# Patient Record
Sex: Male | Born: 1950 | Race: White | Hispanic: No | Marital: Married | Smoking: Current every day smoker
Health system: Southern US, Community
[De-identification: ages and names within clinical notes are randomized; demographics above are authoritative.]

## PROBLEM LIST (undated history)

## (undated) DIAGNOSIS — D45 Polycythemia vera: Secondary | ICD-10-CM

## (undated) DIAGNOSIS — D473 Essential (hemorrhagic) thrombocythemia: Secondary | ICD-10-CM

## (undated) DIAGNOSIS — D75839 Thrombocytosis, unspecified: Secondary | ICD-10-CM

## (undated) DIAGNOSIS — Z720 Tobacco use: Secondary | ICD-10-CM

## (undated) HISTORY — DX: Essential (hemorrhagic) thrombocythemia: D47.3

## (undated) HISTORY — DX: Tobacco use: Z72.0

## (undated) HISTORY — PX: KNEE ARTHROSCOPY W/ MENISCAL REPAIR: SHX1877

## (undated) HISTORY — DX: Thrombocytosis, unspecified: D75.839

## (undated) HISTORY — DX: Polycythemia vera: D45

## (undated) HISTORY — PX: AV FISTULA REPAIR: SHX563

## (undated) HISTORY — PX: TONSILLECTOMY: SHX5217

---

## 1999-08-21 HISTORY — PX: OTHER SURGICAL HISTORY: SHX169

## 2004-07-14 ENCOUNTER — Ambulatory Visit: Payer: Self-pay | Admitting: Orthopaedic Surgery

## 2009-01-06 ENCOUNTER — Ambulatory Visit: Payer: Self-pay | Admitting: Urology

## 2010-09-18 IMAGING — CR DG ABDOMEN 1V
1 series · 1 of 1 positions shown · non-contrast
Comparison: none

REASON FOR EXAM: renal calculi-lithotripsy
COMMENTS:

[view not recorded]
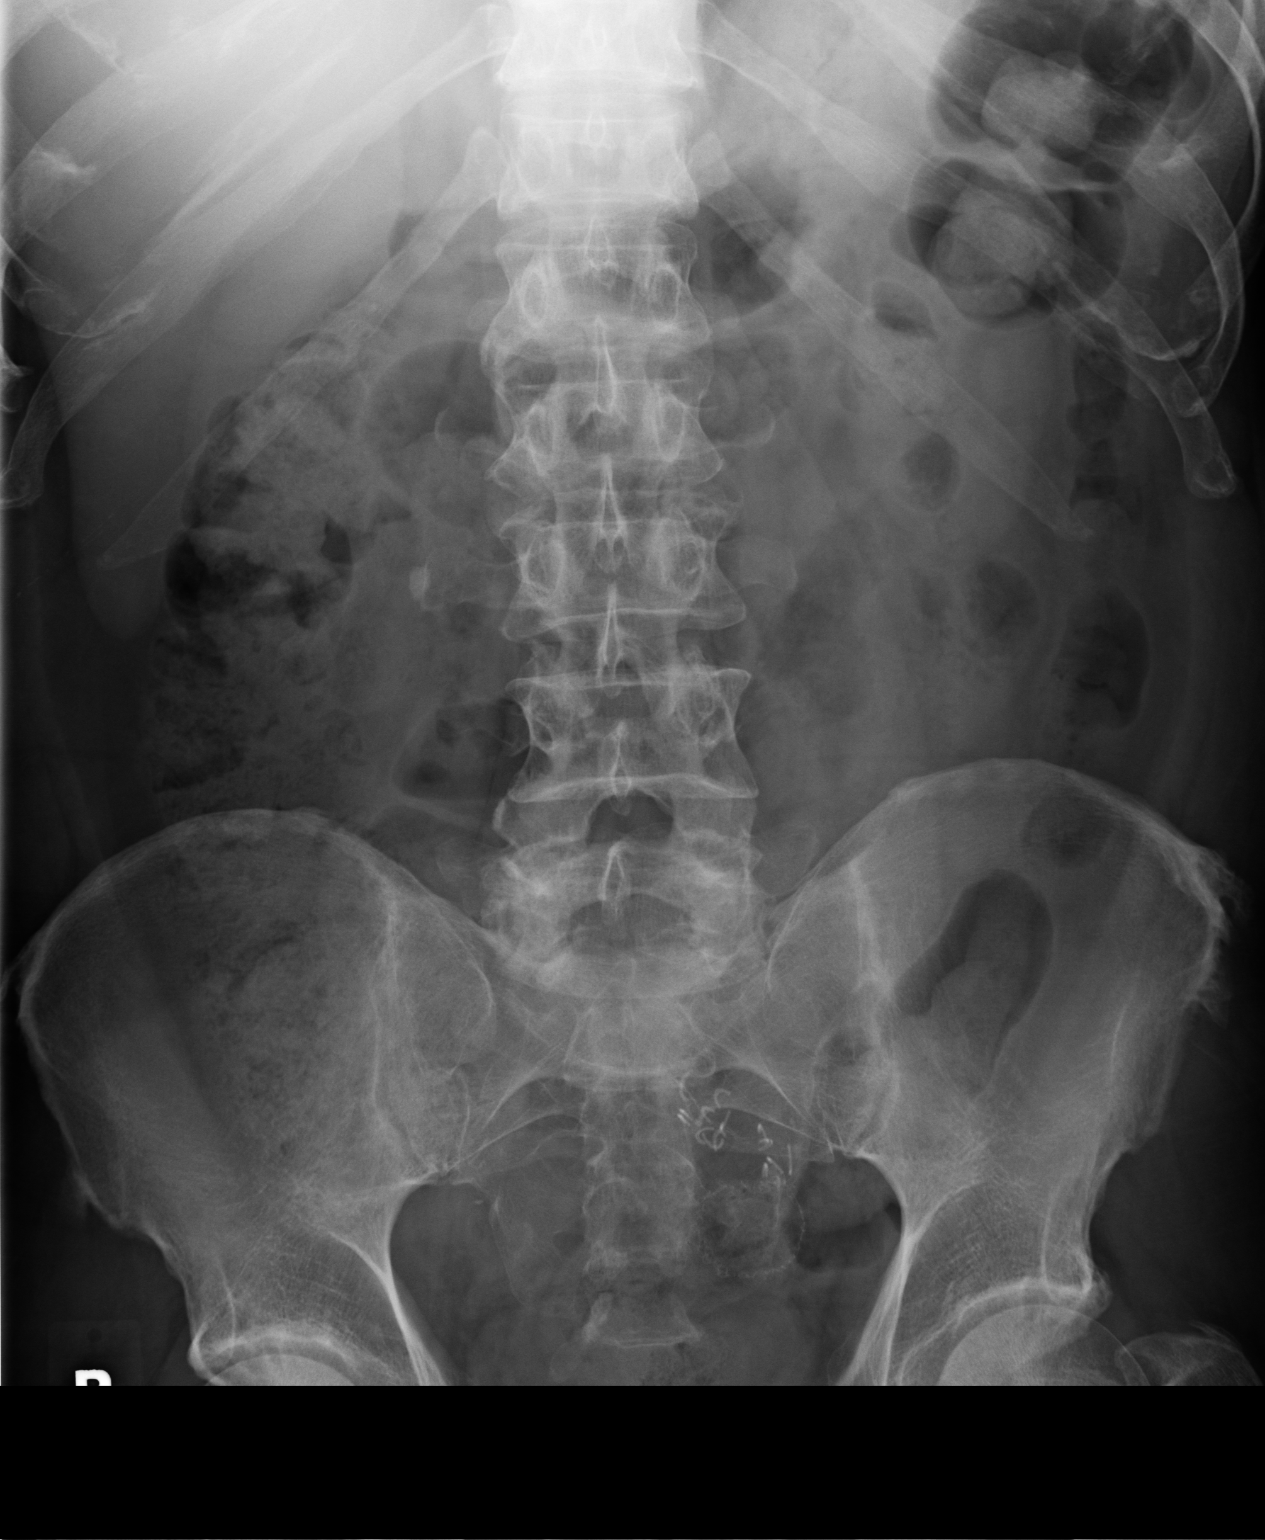

[1 of 1 positions shown; findings below may reference images not displayed]

PROCEDURE:     DXR - DXR KIDNEY URETER BLADDER  - January 06, 2009  [DATE]

RESULT:     There is a 9 mm calcification adjacent to the L3 lumbar
transverse process on the right consistent with a right ureteral stone.
There is a questionable 2 mm density at the upper pole of the right kidney.
This may represent a renal stone or artifact produced by overlying bowel and
bowel content. No left renal or left ureteral stones are seen.
IMPRESSION: 1. Right ureterolithiasis.
2. Possible right nephrolithiasis.

## 2015-12-05 ENCOUNTER — Encounter: Payer: Self-pay | Admitting: *Deleted

## 2015-12-06 ENCOUNTER — Inpatient Hospital Stay: Payer: BLUE CROSS/BLUE SHIELD | Attending: Internal Medicine | Admitting: Internal Medicine

## 2015-12-06 ENCOUNTER — Encounter: Payer: Self-pay | Admitting: Internal Medicine

## 2015-12-06 ENCOUNTER — Inpatient Hospital Stay: Payer: BLUE CROSS/BLUE SHIELD

## 2015-12-06 VITALS — BP 151/78 | HR 91 | Temp 97.1°F | Resp 18 | Ht 68.0 in | Wt 172.6 lb

## 2015-12-06 DIAGNOSIS — D75839 Thrombocytosis, unspecified: Secondary | ICD-10-CM

## 2015-12-06 DIAGNOSIS — D473 Essential (hemorrhagic) thrombocythemia: Secondary | ICD-10-CM

## 2015-12-06 DIAGNOSIS — F17210 Nicotine dependence, cigarettes, uncomplicated: Secondary | ICD-10-CM | POA: Insufficient documentation

## 2015-12-06 DIAGNOSIS — R51 Headache: Secondary | ICD-10-CM

## 2015-12-06 DIAGNOSIS — D72829 Elevated white blood cell count, unspecified: Secondary | ICD-10-CM

## 2015-12-06 DIAGNOSIS — Z79899 Other long term (current) drug therapy: Secondary | ICD-10-CM | POA: Insufficient documentation

## 2015-12-06 LAB — CBC WITH DIFFERENTIAL/PLATELET
Basophils Absolute: 0.1 K/uL (ref 0–0.1)
Basophils Relative: 1 %
Eosinophils Absolute: 0.1 K/uL (ref 0–0.7)
Eosinophils Relative: 1 %
HCT: 52.9 % — ABNORMAL HIGH (ref 40.0–52.0)
Hemoglobin: 18 g/dL (ref 13.0–18.0)
Lymphocytes Relative: 15 %
Lymphs Abs: 1.7 K/uL (ref 1.0–3.6)
MCH: 32 pg (ref 26.0–34.0)
MCHC: 34 g/dL (ref 32.0–36.0)
MCV: 93.9 fL (ref 80.0–100.0)
Monocytes Absolute: 0.6 K/uL (ref 0.2–1.0)
Monocytes Relative: 6 %
Neutro Abs: 8.6 K/uL — ABNORMAL HIGH (ref 1.4–6.5)
Neutrophils Relative %: 77 %
Platelets: 589 K/uL — ABNORMAL HIGH (ref 150–440)
RBC: 5.63 MIL/uL (ref 4.40–5.90)
RDW: 16.5 % — ABNORMAL HIGH (ref 11.5–14.5)
WBC: 11.1 K/uL — ABNORMAL HIGH (ref 3.8–10.6)

## 2015-12-06 LAB — LACTATE DEHYDROGENASE: LDH: 177 U/L (ref 98–192)

## 2015-12-06 NOTE — Progress Notes (Signed)
[facility] NOTE  Patient Care Team: Maryland Pink, MD as PCP - General (Family Medicine)  CHIEF COMPLAINTS/PURPOSE OF CONSULTATION:   # May 2015-THROMBOCYTOSIS [513; April 2017- 719]/ Mild elevated hematocrit/  Mild leukocytosis- ? MPN  HISTORY OF PRESENTING ILLNESS:  Jonathan Evans 65 y.o.  male  Long-standing history of smoking/ current smoker-  Has been referred to [place] for further evaluation of thrombocytosis.  On review of patient's blood counts-  Patient had elevated platelets since May 2015 at 513. More recently has gone up to 719. Patient's most recent hemoglobin is 18 hematocrit 53. White count is elevated at 11.9 predominant neutrophilia.   Patient never had history of strokes;  Or blood clots. Has intermittent headaches.  History of weight loss.  Denies any nausea vomiting difficulty swallowing or pain with swallowing.  Denies any burning sensation in the feet.  His chronic right knee pain/ calf which he attributes to knee injury.  ROS: A complete 10 point review of system is done which is negative except mentioned above in history of present illness  MEDICAL HISTORY:  Past Medical History  Diagnosis Date  . Tobacco abuse   . Thrombocytosis ([place])     SURGICAL HISTORY: Past Surgical History  Procedure Laterality Date  . Av fistula repair    . Knee arthroscopy w/ meniscal repair    . 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  Denies any alcohol. Social History   Social History  . Marital Status: Married    Spouse Name: N/A  . Number of Children: N/A  . Years of Education: N/A   Occupational History  . Not on file.   Social History Main Topics  . Smoking status: Current Every Day Smoker -- 1.50 packs/day for 30 years    Types: Cigarettes  . Smokeless tobacco: Never Used     Comment: uses e-cig  . Alcohol Use: No     Comment: rarely  . Drug Use: No  . Sexual Activity: Not on file   Other Topics  Concern  . Not on file   Social History Narrative    FAMILY HISTORY: dad- stomach cancer- 90s.  Family History  Problem Relation Age of Onset  . Stomach cancer Father     ALLERGIES:  has No Known Allergies.  MEDICATIONS:  Current Outpatient Prescriptions  Medication Sig Dispense Refill  . betamethasone dipropionate (DIPROLENE) 0.05 % cream Apply 1 application topically as needed.    . diclofenac (VOLTAREN) 75 MG EC tablet Take 1 tablet by mouth daily.    Marland Kitchen ibuprofen (ADVIL,MOTRIN) 200 MG tablet Take 200 mg by mouth every 6 (six) hours as needed.    . venlafaxine XR (EFFEXOR-XR) 75 MG 24 hr capsule Take 1 capsule by mouth daily.     No current facility-administered medications for this visit.      Marland Kitchen  PHYSICAL EXAMINATION: ECOG PERFORMANCE STATUS: 0 - Asymptomatic  Filed Vitals:   12/06/15 1143  BP: 151/78  Pulse: 91  Temp: 97.1 F (36.2 C)  Resp: 18   Filed Weights   12/06/15 1143  Weight: 172 lb 9.9 oz (78.3 kg)    GENERAL: Well-nourished well-developed; Alert, no distress and comfortable.   Alone. EYES: no pallor or icterus OROPHARYNX: no thrush or ulceration; dentures. NECK: supple, no masses felt LYMPH:  no palpable lymphadenopathy in the cervical, axillary or inguinal regions LUNGS: bil decreased air entry; No wheeze or crackles HEART/CVS: regular  rate & rhythm and no murmurs; No lower extremity edema ABDOMEN: abdomen soft, non-tender and normal bowel sounds Musculoskeletal:no cyanosis of digits and no clubbing  PSYCH: alert & oriented x 3 with fluent speech NEURO: no focal motor/sensory deficits SKIN:  no rashes or significant lesions    ASSESSMENT & PLAN:   # THROMBOCYTOSIS/ Mild elevated hematocrit/  Mild leukocytosis-  Patient's CBC-  Is concerning for  Myeloproliferative neoplasm.  Recommend  [name] 2 mutation/ in exon 12;  Peripheral blood flow cytometry;  CBC LDH;  MPL;  CALR mutation;  Also check BCR ABL.    Discussed with the patient that  based upon above workup-  CT scan  Bone marrow biopsy might be recommended.  I reviewed the  Bone marrow biopsy procedure/ and potential complications.  I would recommend  A bone marrow biopsy if  Above workup including imaging is inconclusive.   #  Chronic smoker-  Cessation/  Lung cancer screening to discuss at next visit.  #  The above plan of care was discussed the patient in detail.   He will follow-up with me in approximately 2 weeks-  To review the results/ next plan of care.  Thank you Dr. Kary Kos for allowing me to participate in the care of your pleasant patient. Please do not hesitate to contact me with questions or concerns in the interim.  # 30 minutes face-to-face with the patient discussing the above plan of care; more than 50% of time spent on counseling and coordination.    Cammie Sickle, MD 12/06/2015 12:13 PM

## 2015-12-14 LAB — MISC LABCORP TEST (SEND OUT): LABCORP TEST CODE: 489450

## 2015-12-16 LAB — COMP PANEL: LEUKEMIA/LYMPHOMA

## 2015-12-16 LAB — MPL MUTATION ANALYSIS

## 2015-12-16 LAB — BCR-ABL1 FISH
Cells Analyzed: 200
Cells Counted: 200

## 2015-12-16 LAB — JAK2  V617F QUAL. WITH REFLEX TO EXON 12

## 2015-12-22 ENCOUNTER — Inpatient Hospital Stay: Payer: BLUE CROSS/BLUE SHIELD | Admitting: Internal Medicine

## 2015-12-22 ENCOUNTER — Inpatient Hospital Stay: Payer: BLUE CROSS/BLUE SHIELD | Attending: Internal Medicine | Admitting: Internal Medicine

## 2015-12-22 VITALS — BP 144/74 | HR 78 | Temp 98.5°F | Resp 18 | Wt 175.0 lb

## 2015-12-22 DIAGNOSIS — D473 Essential (hemorrhagic) thrombocythemia: Secondary | ICD-10-CM | POA: Insufficient documentation

## 2015-12-22 DIAGNOSIS — Z79899 Other long term (current) drug therapy: Secondary | ICD-10-CM | POA: Insufficient documentation

## 2015-12-22 DIAGNOSIS — Z7982 Long term (current) use of aspirin: Secondary | ICD-10-CM | POA: Diagnosis not present

## 2015-12-22 DIAGNOSIS — F17210 Nicotine dependence, cigarettes, uncomplicated: Secondary | ICD-10-CM | POA: Diagnosis not present

## 2015-12-22 DIAGNOSIS — M25561 Pain in right knee: Secondary | ICD-10-CM | POA: Diagnosis not present

## 2015-12-22 DIAGNOSIS — R51 Headache: Secondary | ICD-10-CM | POA: Insufficient documentation

## 2015-12-22 DIAGNOSIS — D45 Polycythemia vera: Secondary | ICD-10-CM | POA: Diagnosis not present

## 2015-12-22 MED ORDER — HYDROXYUREA 500 MG PO CAPS: 500.0000 mg | ORAL_CAPSULE | Freq: Every day | ORAL | Status: DC

## 2015-12-22 NOTE — Progress Notes (Signed)
Patient ambulates without assistance, brought to exam room 4.  Vitals and medication record documented and updated, information provided by patient.  Patient denies pain or discomfort.

## 2015-12-22 NOTE — Progress Notes (Signed)
[facility]

## 2015-12-22 NOTE — Progress Notes (Signed)
[facility] NOTE  Patient Care Team: Maryland Pink, MD as PCP - General (Family Medicine)  CHIEF COMPLAINTS/PURPOSE OF CONSULTATION:   # April 2017- ET-PV- JAK-2 POSITIVE [HCT 52.9/platelets- 588]; Recom phlebotomy/ Hydrea 500 milligrams a day/ Aspirin 325 mg a day  HISTORY OF PRESENTING ILLNESS:  Jonathan Evans 65 y.o.  male  Long-standing history of smoking/ current smoker-  It is here to review the results of the workup that was ordered for elevated platelets and also hematocrit.  Patient continues to have intermittent headaches. Denies any burning sensation in the feet.  His chronic right knee pain/ calf which he attributes to knee injury.  ROS: A complete 10 point review of system is done which is negative except mentioned above in history of present illness  MEDICAL HISTORY:  Past Medical History  Diagnosis Date  . Tobacco abuse   . Thrombocytosis ([place])     SURGICAL HISTORY: Past Surgical History  Procedure Laterality Date  . Av fistula repair    . Knee arthroscopy w/ meniscal repair    . 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 Social History   Social History  . Marital Status: Married    Spouse Name: N/A  . Number of Children: N/A  . Years of Education: N/A   Occupational History  . Not on file.   Social History Main Topics  . Smoking status: Current Every Day Smoker -- 1.50 packs/day for 30 years    Types: Cigarettes  . Smokeless tobacco: Never Used     Comment: uses e-cig  . Alcohol Use: No     Comment: rarely  . Drug Use: No  . Sexual Activity: Not on file   Other Topics Concern  . Not on file   Social History Narrative    FAMILY HISTORY: dad- stomach cancer- 5s.  Family History  Problem Relation Age of Onset  . Stomach cancer Father     ALLERGIES:  has No Known Allergies.  MEDICATIONS:  Current Outpatient Prescriptions  Medication Sig Dispense Refill   . diclofenac (VOLTAREN) 75 MG EC tablet Take 1 tablet by mouth daily.    Marland Kitchen ibuprofen (ADVIL,MOTRIN) 200 MG tablet Take 200 mg by mouth every 6 (six) hours as needed.    . venlafaxine XR (EFFEXOR-XR) 75 MG 24 hr capsule Take 1 capsule by mouth daily.     No current facility-administered medications for this visit.      Marland Kitchen  PHYSICAL EXAMINATION: ECOG PERFORMANCE STATUS: 0 - Asymptomatic  Filed Vitals:   12/22/15 1433  BP: 144/74  Pulse: 78  Temp: 98.5 F (36.9 C)   Filed Weights   12/22/15 1433  Weight: 175 lb 0.7 oz (79.4 kg)    GENERAL: Well-nourished well-developed; Alert, no distress and comfortable.   Alone.    ASSESSMENT & PLAN:   # Polycythemia vera/essential thrombocytosis- JAK-2 POSITIVE- discussed the risk of stroke and thromboembolic events from elevated hematocrit and elevated platelets. I recommend phlebotomy to bring the hematocrit less than 45; patient is currently on aspirin 81 mg 2 a day.   # I also reviewed the importance of using Hydrea in burningthe hematocrit down and also the platelet count down.  I discussed the potential side effects of Hydrea including but not limited to LFT abnormalities rash and palms and soles; Oral ulcers.   # SMOKING-also discussed smoking cessation. Patient seems to motivated. We will  Lung cancer  screening to discuss at next visit.  # phlebotomy on a weekly basis [hold if hematocrit less than 43]; weekly H&H; follow-up with me in 6 weeks CBC and LFTs.   # 25 minutes face-to-face with the patient discussing the above plan of care; more than 50% of time spent on prognosis/ natural history; counseling and coordination.     Cammie Sickle, MD 12/22/2015 2:41 PM

## 2015-12-23 ENCOUNTER — Telehealth: Payer: Self-pay | Admitting: *Deleted

## 2015-12-23 NOTE — Telephone Encounter (Signed)
Pt's wife called states [facility]  I called pt back and explained the [facility] [address] Fax Confirmation received.   Pt [facility] [address].  [facility] [address]  This pharmacy will transfer the rx to that pharmacy. Pt informed.

## 2015-12-29 ENCOUNTER — Inpatient Hospital Stay: Payer: BLUE CROSS/BLUE SHIELD

## 2015-12-29 ENCOUNTER — Encounter: Payer: Self-pay | Admitting: *Deleted

## 2015-12-29 VITALS — BP 123/80 | HR 76 | Resp 20

## 2015-12-29 DIAGNOSIS — D45 Polycythemia vera: Secondary | ICD-10-CM

## 2015-12-29 DIAGNOSIS — D473 Essential (hemorrhagic) thrombocythemia: Secondary | ICD-10-CM

## 2015-12-29 LAB — HEMOGLOBIN: Hemoglobin: 18 g/dL (ref 13.0–18.0)

## 2015-12-29 LAB — HEMATOCRIT: HCT: 52.6 % — ABNORMAL HIGH (ref 40.0–52.0)

## 2015-12-29 MED ORDER — HYDROXYUREA 500 MG PO CAPS: 500.0000 mg | ORAL_CAPSULE | Freq: Every day | ORAL | Status: DC

## 2015-12-29 NOTE — Progress Notes (Signed)
Received a call from [facility] lab regarding patient rx.  He informed Doni that he has checked with his pharmacy, [facility]. [address], but the rx for Hydrea was not sent.  It is documented in his chart that he did [facility] [address] but he is now requesting [facility].  [facility] and will send a new rx to [facility].

## 2015-12-29 NOTE — Progress Notes (Signed)
Pt came for phlebotomy today. Pt spoke to [place], Therapist, sports in infusion. States that his RX for hydrea needs to be sent to pharmacy. Never sent per patient.  I explained to pt that [facility] [address] [facility] [address] per phone note. [facility] [address]  He states that he meant to say [facility] on [address].  He states that the [facility] will [facility] [address] to pick up rx.

## 2015-12-30 ENCOUNTER — Other Ambulatory Visit: Payer: Self-pay | Admitting: *Deleted

## 2015-12-30 ENCOUNTER — Telehealth: Payer: Self-pay | Admitting: *Deleted

## 2015-12-30 DIAGNOSIS — D473 Essential (hemorrhagic) thrombocythemia: Secondary | ICD-10-CM

## 2015-12-30 DIAGNOSIS — D45 Polycythemia vera: Secondary | ICD-10-CM

## 2015-12-30 MED ORDER — HYDROXYUREA 500 MG PO CAPS: 500.0000 mg | ORAL_CAPSULE | Freq: Every day | ORAL | Status: DC

## 2015-12-30 NOTE — Telephone Encounter (Signed)
Patients family sent fax stating Hydrea was sent to the wrong pharmacy, Hydrea RX sent to [facility] on [address] in [place]. Patient preferred pharmacy updated in system.

## 2016-01-05 ENCOUNTER — Inpatient Hospital Stay: Payer: BLUE CROSS/BLUE SHIELD

## 2016-01-05 DIAGNOSIS — D45 Polycythemia vera: Secondary | ICD-10-CM | POA: Diagnosis not present

## 2016-01-05 DIAGNOSIS — D473 Essential (hemorrhagic) thrombocythemia: Secondary | ICD-10-CM

## 2016-01-05 LAB — HEMATOCRIT: HCT: 51.4 % (ref 40.0–52.0)

## 2016-01-05 LAB — HEMOGLOBIN: Hemoglobin: 17.7 g/dL (ref 13.0–18.0)

## 2016-01-12 ENCOUNTER — Inpatient Hospital Stay: Payer: BLUE CROSS/BLUE SHIELD

## 2016-01-12 ENCOUNTER — Other Ambulatory Visit: Payer: Self-pay | Admitting: Internal Medicine

## 2016-01-12 VITALS — BP 115/72 | HR 76 | Temp 96.3°F | Resp 18

## 2016-01-12 DIAGNOSIS — D45 Polycythemia vera: Secondary | ICD-10-CM | POA: Diagnosis not present

## 2016-01-12 DIAGNOSIS — D473 Essential (hemorrhagic) thrombocythemia: Secondary | ICD-10-CM

## 2016-01-12 LAB — HEMOGLOBIN: Hemoglobin: 16.5 g/dL (ref 13.0–18.0)

## 2016-01-12 LAB — HEMATOCRIT: HCT: 46.9 % (ref 40.0–52.0)

## 2016-01-19 ENCOUNTER — Inpatient Hospital Stay: Payer: BLUE CROSS/BLUE SHIELD | Attending: Internal Medicine

## 2016-01-19 ENCOUNTER — Inpatient Hospital Stay: Payer: BLUE CROSS/BLUE SHIELD

## 2016-01-19 VITALS — BP 122/71 | HR 73 | Temp 97.8°F | Resp 20

## 2016-01-19 DIAGNOSIS — F17210 Nicotine dependence, cigarettes, uncomplicated: Secondary | ICD-10-CM | POA: Insufficient documentation

## 2016-01-19 DIAGNOSIS — D45 Polycythemia vera: Secondary | ICD-10-CM

## 2016-01-19 DIAGNOSIS — D473 Essential (hemorrhagic) thrombocythemia: Secondary | ICD-10-CM | POA: Diagnosis not present

## 2016-01-19 DIAGNOSIS — Z79899 Other long term (current) drug therapy: Secondary | ICD-10-CM | POA: Diagnosis not present

## 2016-01-19 LAB — HEMOGLOBIN: Hemoglobin: 15.7 g/dL (ref 13.0–18.0)

## 2016-01-19 LAB — HEMATOCRIT: HCT: 44.7 % (ref 40.0–52.0)

## 2016-01-26 ENCOUNTER — Inpatient Hospital Stay: Payer: BLUE CROSS/BLUE SHIELD

## 2016-01-26 DIAGNOSIS — D473 Essential (hemorrhagic) thrombocythemia: Secondary | ICD-10-CM

## 2016-01-26 DIAGNOSIS — D45 Polycythemia vera: Secondary | ICD-10-CM | POA: Diagnosis not present

## 2016-01-26 LAB — HEMOGLOBIN: HEMOGLOBIN: 15 g/dL (ref 13.0–18.0)

## 2016-01-26 LAB — HEMATOCRIT: HEMATOCRIT: 43.5 % (ref 40.0–52.0)

## 2016-02-02 ENCOUNTER — Inpatient Hospital Stay (HOSPITAL_BASED_OUTPATIENT_CLINIC_OR_DEPARTMENT_OTHER): Payer: BLUE CROSS/BLUE SHIELD | Admitting: Oncology

## 2016-02-02 ENCOUNTER — Inpatient Hospital Stay: Payer: BLUE CROSS/BLUE SHIELD

## 2016-02-02 ENCOUNTER — Other Ambulatory Visit: Payer: Self-pay | Admitting: Internal Medicine

## 2016-02-02 VITALS — BP 136/60 | HR 69 | Temp 96.0°F | Resp 18 | Wt 173.7 lb

## 2016-02-02 DIAGNOSIS — D473 Essential (hemorrhagic) thrombocythemia: Secondary | ICD-10-CM

## 2016-02-02 DIAGNOSIS — F17210 Nicotine dependence, cigarettes, uncomplicated: Secondary | ICD-10-CM | POA: Diagnosis not present

## 2016-02-02 DIAGNOSIS — Z79899 Other long term (current) drug therapy: Secondary | ICD-10-CM

## 2016-02-02 DIAGNOSIS — D45 Polycythemia vera: Secondary | ICD-10-CM

## 2016-02-02 LAB — COMPREHENSIVE METABOLIC PANEL
ALT: 20 U/L (ref 17–63)
AST: 20 U/L (ref 15–41)
Albumin: 3.8 g/dL (ref 3.5–5.0)
Alkaline Phosphatase: 99 U/L (ref 38–126)
Anion gap: 10 (ref 5–15)
BILIRUBIN TOTAL: 0.7 mg/dL (ref 0.3–1.2)
BUN: 27 mg/dL — AB (ref 6–20)
CHLORIDE: 108 mmol/L (ref 101–111)
CO2: 23 mmol/L (ref 22–32)
Calcium: 8.9 mg/dL (ref 8.9–10.3)
Creatinine, Ser: 1.15 mg/dL (ref 0.61–1.24)
GFR calc Af Amer: >60 mL/min (ref >60)
GFR calc non Af Amer: >60 mL/min (ref >60)
GLUCOSE: 145 mg/dL — AB (ref 65–99)
POTASSIUM: 3.9 mmol/L (ref 3.5–5.1)
SODIUM: 141 mmol/L (ref 135–145)
Total Protein: 6.3 g/dL — ABNORMAL LOW (ref 6.5–8.1)

## 2016-02-02 LAB — CBC WITH DIFFERENTIAL/PLATELET
BASOS ABS: 0.1 10*3/uL (ref 0–0.1)
BASOS PCT: 1 %
Eosinophils Absolute: 0.2 10*3/uL (ref 0–0.7)
Eosinophils Relative: 2 %
HEMATOCRIT: 42.9 % (ref 40.0–52.0)
Hemoglobin: 14.9 g/dL (ref 13.0–18.0)
Lymphocytes Relative: 25 %
Lymphs Abs: 2.2 10*3/uL (ref 1.0–3.6)
MCH: 33.8 pg (ref 26.0–34.0)
MCHC: 34.7 g/dL (ref 32.0–36.0)
MCV: 97.2 fL (ref 80.0–100.0)
MONO ABS: 0.6 10*3/uL (ref 0.2–1.0)
Monocytes Relative: 7 %
NEUTROS ABS: 5.6 10*3/uL (ref 1.4–6.5)
Neutrophils Relative %: 65 %
PLATELETS: 603 10*3/uL — AB (ref 150–440)
RBC: 4.41 MIL/uL (ref 4.40–5.90)
RDW: 18.4 % — AB (ref 11.5–14.5)
WBC: 8.6 10*3/uL (ref 3.8–10.6)

## 2016-02-02 NOTE — Progress Notes (Signed)
States is general muscle aches from increased activity at work but otherwise feels well.

## 2016-02-02 NOTE — Progress Notes (Signed)
[facility]  [phone]  Fax:([phone]) [place] DOB: [birth year]  MR#: [id number]  QPY#:[id number]  Patient Care Team: Maryland Pink, MD as PCP - General (Family Medicine)  CHIEF COMPLAINT:  Chief Complaint  Patient presents with  . polycythemia    INTERVAL HISTORY:  Patient returns to clinic today for lab results and possible phlebotomy. He currently feels well and is asymptomatic. He continues with Hydrea 500 mg daily and is tolerating is well without side effects. He denies any pain. He has no complaints today.  REVIEW OF SYSTEMS:   Review of Systems  Constitutional: Negative.   HENT: Negative.   Eyes: Negative.   Respiratory: Negative.   Cardiovascular: Negative.   Gastrointestinal: Negative.   Genitourinary: Negative.   Musculoskeletal: Negative.   Skin: Negative.   Neurological: Negative.   Endo/Heme/Allergies: Negative.   Psychiatric/Behavioral: Negative.     As per HPI. Otherwise, a complete review of systems is negatve.  ONCOLOGY HISTORY:  No history exists.    PAST MEDICAL HISTORY: Past Medical History  Diagnosis Date  . Tobacco abuse   . Thrombocytosis ([place])     PAST SURGICAL HISTORY: Past Surgical History  Procedure Laterality Date  . Av fistula repair    . Knee arthroscopy w/ meniscal repair    . Tonsillectomy      FAMILY HISTORY Family History  Problem Relation Age of Onset  . Stomach cancer Father     ADVANCED DIRECTIVES:    HEALTH MAINTENANCE: Social History  Substance Use Topics  . Smoking status: Current Every Day Smoker -- 1.50 packs/day for 30 years    Types: Cigarettes  . Smokeless tobacco: Never Used     Comment: uses e-cig  . Alcohol Use: No     Comment: rarely    No Known Allergies  Current Outpatient Prescriptions  Medication Sig Dispense Refill  . diclofenac (VOLTAREN) 75 MG EC tablet Take 1 tablet by mouth daily.    . hydroxyurea (HYDREA) 500 MG capsule Take 1 capsule  (500 mg total) by mouth daily. May take with food to minimize GI side effects. 30 capsule 3  . ibuprofen (ADVIL,MOTRIN) 200 MG tablet Take 200 mg by mouth every 6 (six) hours as needed.    . venlafaxine XR (EFFEXOR-XR) 75 MG 24 hr capsule Take 1 capsule by mouth daily.     No current facility-administered medications for this visit.    OBJECTIVE: BP 136/60 mmHg  Pulse 69  Temp(Src) 96 F (35.6 C) (Tympanic)  Resp 18  Wt 173 lb 11.6 oz (78.8 kg)   Body mass index is 26.42 kg/(m^2).    ECOG FS:0 - Asymptomatic  General: Well-developed, well-nourished, no acute distress. Lungs: Clear to auscultation bilaterally. Heart: Regular rate and rhythm. No rubs, murmurs, or gallops.  Musculoskeletal: No edema, cyanosis, or clubbing. Neuro: Alert, answering all questions appropriately.  Psych: Normal affect.   LAB RESULTS:  Appointment on 02/02/2016  Component Date Value Ref Range Status  . WBC 02/02/2016 8.6  3.8 - 10.6 K/uL Final  . RBC 02/02/2016 4.41  4.40 - 5.90 MIL/uL Final  . Hemoglobin 02/02/2016 14.9  13.0 - 18.0 g/dL Final  . HCT 02/02/2016 42.9  40.0 - 52.0 % Final  . MCV 02/02/2016 97.2  80.0 - 100.0 fL Final  . MCH 02/02/2016 33.8  26.0 - 34.0 pg Final  . MCHC 02/02/2016 34.7  32.0 - 36.0 g/dL Final  . RDW 02/02/2016 18.4* 11.5 - 14.5 %  Final  . Platelets 02/02/2016 603* 150 - 440 K/uL Final  . Neutrophils Relative % 02/02/2016 65   Final  . Neutro Abs 02/02/2016 5.6  1.4 - 6.5 K/uL Final  . Lymphocytes Relative 02/02/2016 25   Final  . Lymphs Abs 02/02/2016 2.2  1.0 - 3.6 K/uL Final  . Monocytes Relative 02/02/2016 7   Final  . Monocytes Absolute 02/02/2016 0.6  0.2 - 1.0 K/uL Final  . Eosinophils Relative 02/02/2016 2   Final  . Eosinophils Absolute 02/02/2016 0.2  0 - 0.7 K/uL Final  . Basophils Relative 02/02/2016 1   Final  . Basophils Absolute 02/02/2016 0.1  0 - 0.1 K/uL Final  . Sodium 02/02/2016 141  135 - 145 mmol/L Final  . Potassium 02/02/2016 3.9  3.5 - 5.1  mmol/L Final  . Chloride 02/02/2016 108  101 - 111 mmol/L Final  . CO2 02/02/2016 23  22 - 32 mmol/L Final  . Glucose, Bld 02/02/2016 145* 65 - 99 mg/dL Final  . BUN 02/02/2016 27* 6 - 20 mg/dL Final  . Creatinine, Ser 02/02/2016 1.15  0.61 - 1.24 mg/dL Final  . Calcium 02/02/2016 8.9  8.9 - 10.3 mg/dL Final  . Total Protein 02/02/2016 6.3* 6.5 - 8.1 g/dL Final  . Albumin 02/02/2016 3.8  3.5 - 5.0 g/dL Final  . AST 02/02/2016 20  15 - 41 U/L Final  . ALT 02/02/2016 20  17 - 63 U/L Final  . Alkaline Phosphatase 02/02/2016 99  38 - 126 U/L Final  . Total Bilirubin 02/02/2016 0.7  0.3 - 1.2 mg/dL Final  . GFR calc non Af Amer 02/02/2016 >60  >60 mL/min Final  . GFR calc Af Amer 02/02/2016 >60  >60 mL/min Final   Comment: (NOTE) The eGFR has been calculated using the CKD EPI equation. This calculation has not been validated in all clinical situations. eGFR's persistently <60 mL/min signify possible Chronic Kidney Disease.   . Anion gap 02/02/2016 10  5 - 15 Final    STUDIES: No results found.  ASSESSMENT: Polycythemia  PLAN:    1. Polycythemia vera/essential thrombocytosis- JAK-2 POSITIVE- HCT today is 42.9, no phlebotomy needed. Platelets are 603 today - increase Hydrea - take 500 mg BID.   2. Smoking- Discuss lung cancer screening at next visit.  3. Return to clinic in 6 weeks for HGB/HCT and possible phlebotomy and then in 3 months for CBC/CMP and further evaluation.  Patient expressed understanding and was in agreement with this plan. He also understands that He can call clinic at any time with any questions, concerns, or complaints.   Dr. Rogue Bussing was available for consultation and review of plan of care for this patient.    Mayra Reel, NP   02/02/2016 2:45 PM

## 2016-02-03 ENCOUNTER — Inpatient Hospital Stay: Payer: BLUE CROSS/BLUE SHIELD | Admitting: Internal Medicine

## 2016-02-03 ENCOUNTER — Inpatient Hospital Stay: Payer: BLUE CROSS/BLUE SHIELD

## 2016-03-15 ENCOUNTER — Inpatient Hospital Stay: Payer: BLUE CROSS/BLUE SHIELD | Attending: Internal Medicine

## 2016-03-15 ENCOUNTER — Inpatient Hospital Stay: Payer: BLUE CROSS/BLUE SHIELD

## 2016-03-15 DIAGNOSIS — F17210 Nicotine dependence, cigarettes, uncomplicated: Secondary | ICD-10-CM | POA: Insufficient documentation

## 2016-03-15 DIAGNOSIS — D45 Polycythemia vera: Secondary | ICD-10-CM

## 2016-03-15 DIAGNOSIS — D473 Essential (hemorrhagic) thrombocythemia: Secondary | ICD-10-CM | POA: Diagnosis not present

## 2016-03-15 DIAGNOSIS — Z79899 Other long term (current) drug therapy: Secondary | ICD-10-CM | POA: Insufficient documentation

## 2016-03-15 LAB — HEMOGLOBIN: Hemoglobin: 17.1 g/dL (ref 13.0–18.0)

## 2016-03-15 LAB — HEMATOCRIT: HEMATOCRIT: 49.6 % (ref 40.0–52.0)

## 2016-03-16 ENCOUNTER — Other Ambulatory Visit: Payer: Self-pay | Admitting: Internal Medicine

## 2016-03-19 ENCOUNTER — Other Ambulatory Visit: Payer: Self-pay | Admitting: *Deleted

## 2016-03-19 ENCOUNTER — Telehealth: Payer: Self-pay | Admitting: *Deleted

## 2016-03-19 DIAGNOSIS — D45 Polycythemia vera: Secondary | ICD-10-CM

## 2016-03-19 NOTE — Telephone Encounter (Signed)
-----   Message from Cammie Sickle, MD sent at 03/16/2016  7:37 PM EDT ----- Please check the patient if taking Hydrea 500 mg twice a day.  I recommend  H&H weekly/ phlebotomy  Weekly-as his hematocrit has increased to 49. Please inform pt.

## 2016-03-19 NOTE — Telephone Encounter (Signed)
Called patient to inform him that MD recommends weekly H/H and phlebotomy.  [place] the beach [place] so he will be available week after next.

## 2016-03-26 ENCOUNTER — Inpatient Hospital Stay: Payer: BLUE CROSS/BLUE SHIELD

## 2016-03-26 ENCOUNTER — Inpatient Hospital Stay: Payer: BLUE CROSS/BLUE SHIELD | Attending: Internal Medicine

## 2016-03-26 VITALS — BP 124/73 | HR 74 | Temp 97.3°F | Resp 18

## 2016-03-26 DIAGNOSIS — D45 Polycythemia vera: Secondary | ICD-10-CM | POA: Insufficient documentation

## 2016-03-26 DIAGNOSIS — F17210 Nicotine dependence, cigarettes, uncomplicated: Secondary | ICD-10-CM | POA: Insufficient documentation

## 2016-03-26 DIAGNOSIS — Z79899 Other long term (current) drug therapy: Secondary | ICD-10-CM | POA: Diagnosis not present

## 2016-03-26 DIAGNOSIS — D473 Essential (hemorrhagic) thrombocythemia: Secondary | ICD-10-CM | POA: Insufficient documentation

## 2016-03-26 LAB — HEMATOCRIT: HCT: 46.1 % (ref 40.0–52.0)

## 2016-03-26 LAB — HEMOGLOBIN: Hemoglobin: 16 g/dL (ref 13.0–18.0)

## 2016-04-02 ENCOUNTER — Inpatient Hospital Stay: Payer: BLUE CROSS/BLUE SHIELD

## 2016-04-02 DIAGNOSIS — D45 Polycythemia vera: Secondary | ICD-10-CM | POA: Diagnosis not present

## 2016-04-02 LAB — HEMOGLOBIN: HEMOGLOBIN: 15.5 g/dL (ref 13.0–18.0)

## 2016-04-02 LAB — HEMATOCRIT: HCT: 44.6 % (ref 40.0–52.0)

## 2016-04-09 ENCOUNTER — Inpatient Hospital Stay: Payer: BLUE CROSS/BLUE SHIELD

## 2016-04-09 DIAGNOSIS — D45 Polycythemia vera: Secondary | ICD-10-CM

## 2016-04-09 LAB — HEMATOCRIT: HEMATOCRIT: 42.9 % (ref 40.0–52.0)

## 2016-04-09 LAB — HEMOGLOBIN: Hemoglobin: 15 g/dL (ref 13.0–18.0)

## 2016-04-16 ENCOUNTER — Inpatient Hospital Stay: Payer: BLUE CROSS/BLUE SHIELD

## 2016-04-16 VITALS — BP 125/73 | HR 76 | Temp 96.7°F | Resp 18

## 2016-04-16 DIAGNOSIS — D45 Polycythemia vera: Secondary | ICD-10-CM

## 2016-04-16 LAB — HEMATOCRIT: HCT: 44.5 % (ref 40.0–52.0)

## 2016-04-16 LAB — HEMOGLOBIN: HEMOGLOBIN: 15.3 g/dL (ref 13.0–18.0)

## 2016-04-23 ENCOUNTER — Other Ambulatory Visit: Payer: Self-pay | Admitting: Internal Medicine

## 2016-04-23 DIAGNOSIS — D45 Polycythemia vera: Secondary | ICD-10-CM

## 2016-04-23 DIAGNOSIS — D473 Essential (hemorrhagic) thrombocythemia: Secondary | ICD-10-CM

## 2016-04-24 ENCOUNTER — Other Ambulatory Visit: Payer: Self-pay | Admitting: *Deleted

## 2016-04-24 ENCOUNTER — Inpatient Hospital Stay: Payer: BLUE CROSS/BLUE SHIELD

## 2016-04-24 ENCOUNTER — Inpatient Hospital Stay: Payer: BLUE CROSS/BLUE SHIELD | Attending: Internal Medicine

## 2016-04-24 DIAGNOSIS — D45 Polycythemia vera: Secondary | ICD-10-CM | POA: Diagnosis present

## 2016-04-24 DIAGNOSIS — D473 Essential (hemorrhagic) thrombocythemia: Secondary | ICD-10-CM | POA: Diagnosis not present

## 2016-04-24 DIAGNOSIS — F17210 Nicotine dependence, cigarettes, uncomplicated: Secondary | ICD-10-CM | POA: Diagnosis not present

## 2016-04-24 DIAGNOSIS — Z79899 Other long term (current) drug therapy: Secondary | ICD-10-CM | POA: Diagnosis not present

## 2016-04-24 LAB — HEMATOCRIT: HCT: 42.5 % (ref 40.0–52.0)

## 2016-04-24 LAB — HEMOGLOBIN: Hemoglobin: 14.8 g/dL (ref 13.0–18.0)

## 2016-04-24 MED ORDER — HYDROXYUREA 500 MG PO CAPS: 500.0000 mg | ORAL_CAPSULE | Freq: Two times a day (BID) | ORAL | 3 refills | Status: DC

## 2016-04-24 NOTE — Progress Notes (Signed)
Pt came to cancer center. rx for hydrea should have been written as 500 mg hydrea twice daily. Instead, rx was written for once daily.  Spoke with MD. Pt was instructed previously to take twice daily.  Resubmitted new rx for hydrea 500 mg twice daily #60 and 3Rfs v/o Dr. Rogue Bussing

## 2016-05-04 ENCOUNTER — Other Ambulatory Visit: Payer: Self-pay

## 2016-05-04 ENCOUNTER — Encounter: Payer: Self-pay | Admitting: Internal Medicine

## 2016-05-04 ENCOUNTER — Inpatient Hospital Stay: Payer: BLUE CROSS/BLUE SHIELD

## 2016-05-04 ENCOUNTER — Inpatient Hospital Stay (HOSPITAL_BASED_OUTPATIENT_CLINIC_OR_DEPARTMENT_OTHER): Payer: BLUE CROSS/BLUE SHIELD | Admitting: Internal Medicine

## 2016-05-04 VITALS — BP 152/74 | HR 76 | Temp 99.0°F | Resp 18 | Ht 68.0 in | Wt 172.0 lb

## 2016-05-04 DIAGNOSIS — D45 Polycythemia vera: Secondary | ICD-10-CM

## 2016-05-04 DIAGNOSIS — Z79899 Other long term (current) drug therapy: Secondary | ICD-10-CM | POA: Diagnosis not present

## 2016-05-04 DIAGNOSIS — F17210 Nicotine dependence, cigarettes, uncomplicated: Secondary | ICD-10-CM

## 2016-05-04 DIAGNOSIS — D473 Essential (hemorrhagic) thrombocythemia: Secondary | ICD-10-CM

## 2016-05-04 LAB — CBC WITH DIFFERENTIAL/PLATELET
BASOS PCT: 1 %
Basophils Absolute: 0.1 10*3/uL (ref 0–0.1)
EOS ABS: 0.1 10*3/uL (ref 0–0.7)
Eosinophils Relative: 2 %
HEMATOCRIT: 42.4 % (ref 40.0–52.0)
HEMOGLOBIN: 14.7 g/dL (ref 13.0–18.0)
LYMPHS ABS: 1.9 10*3/uL (ref 1.0–3.6)
Lymphocytes Relative: 22 %
MCH: 35.4 pg — AB (ref 26.0–34.0)
MCHC: 34.7 g/dL (ref 32.0–36.0)
MCV: 102 fL — ABNORMAL HIGH (ref 80.0–100.0)
MONO ABS: 0.5 10*3/uL (ref 0.2–1.0)
MONOS PCT: 6 %
NEUTROS PCT: 69 %
Neutro Abs: 6.1 10*3/uL (ref 1.4–6.5)
Platelets: 457 10*3/uL — ABNORMAL HIGH (ref 150–440)
RBC: 4.15 MIL/uL — ABNORMAL LOW (ref 4.40–5.90)
RDW: 15.8 % — AB (ref 11.5–14.5)
WBC: 8.7 10*3/uL (ref 3.8–10.6)

## 2016-05-04 LAB — COMPREHENSIVE METABOLIC PANEL
ALBUMIN: 3.9 g/dL (ref 3.5–5.0)
ALK PHOS: 94 U/L (ref 38–126)
ALT: 16 U/L — ABNORMAL LOW (ref 17–63)
ANION GAP: 5 (ref 5–15)
AST: 17 U/L (ref 15–41)
BILIRUBIN TOTAL: 0.7 mg/dL (ref 0.3–1.2)
BUN: 23 mg/dL — ABNORMAL HIGH (ref 6–20)
CALCIUM: 8.7 mg/dL — AB (ref 8.9–10.3)
CHLORIDE: 108 mmol/L (ref 101–111)
CO2: 24 mmol/L (ref 22–32)
CREATININE: 1.09 mg/dL (ref 0.61–1.24)
GFR calc non Af Amer: >60 mL/min (ref >60)
Glucose, Bld: 105 mg/dL — ABNORMAL HIGH (ref 65–99)
Potassium: 4 mmol/L (ref 3.5–5.1)
SODIUM: 137 mmol/L (ref 135–145)
Total Protein: 6.4 g/dL — ABNORMAL LOW (ref 6.5–8.1)

## 2016-05-04 NOTE — Progress Notes (Signed)
Patient reports intermittent episodes of minor numbness in feet after standing for long periods of time.  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 He is uncertain if this is related to the hydrea. The numbness tingling started after taking hydrea.  However, the aches in his feet started before this medication.

## 2016-05-04 NOTE — Progress Notes (Signed)
[facility] NOTE  Patient Care Team: Maryland Pink, MD as PCP - General (Family Medicine)  CHIEF COMPLAINTS/PURPOSE OF CONSULTATION:   # April 2017- ESSENTIAL THROMBOCYTOSIS/-POLYCYTHEMIA VERA- JAK-2 POSITIVE [HCT 52.9/platelets- 010]; Recom phlebotomy/ Hydrea 500 BID / Aspirin 325 mg a day  # Smoker- Recm quitting  HISTORY OF PRESENTING ILLNESS:  Jonathan Evans 65 y.o.  male  history of [name] 2 positive polycythemia vera currently on Hydrea 500 mg twice a day; and also need phlebotomy every month or so is here for follow-up.  Patient denies any significant headaches. His chronic mild tingling and numbness in his feet. Not any worse. He is an aspirin. Denies any chest pain or shortness of breath. Unfortunately continues to smoke.  ROS: A complete 10 point review of system is done which is negative except mentioned above in history of present illness  MEDICAL HISTORY:  Past Medical History:  Diagnosis Date  . Polycythemia vera ([place])   . Thrombocytosis ([place])   . Tobacco abuse     SURGICAL HISTORY: Past Surgical History:  Procedure Laterality Date  . AV FISTULA REPAIR    . KNEE ARTHROSCOPY W/ MENISCAL REPAIR    . 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 Social History   Social History  . Marital status: Married    Spouse name: N/A  . Number of children: N/A  . Years of education: N/A   Occupational History  . Not on file.   Social History Main Topics  . Smoking status: Current Every Day Smoker    Packs/day: 1.50    Years: 30.00    Types: Cigarettes  . Smokeless tobacco: Never Used     Comment: uses e-cig  . Alcohol use No     Comment: rarely  . Drug use: No  . Sexual activity: Not on file   Other Topics Concern  . Not on file   Social History Narrative  . No narrative on file    FAMILY HISTORY: dad- stomach cancer- 36s.  Family History  Problem Relation Age of Onset   . Stomach cancer Father     ALLERGIES:  has No Known Allergies.  MEDICATIONS:  Current Outpatient Prescriptions  Medication Sig Dispense Refill  . hydroxyurea (HYDREA) 500 MG capsule Take 1 capsule (500 mg total) by mouth 2 (two) times daily. May take with food to minimize GI side effects. 60 capsule 3  . ibuprofen (ADVIL,MOTRIN) 200 MG tablet Take 200 mg by mouth every 6 (six) hours as needed.    . diclofenac (VOLTAREN) 75 MG EC tablet Take 1 tablet by mouth daily.  1   No current facility-administered medications for this visit.       Marland Kitchen  PHYSICAL EXAMINATION: ECOG PERFORMANCE STATUS: 0 - Asymptomatic  Vitals:   05/04/16 1404  BP: (!) 152/74  Pulse: 76  Resp: 18  Temp: 99 F (37.2 C)   Filed Weights   05/04/16 1404  Weight: 172 lb (78 kg)    GENERAL: Well-nourished well-developed; Alert, no distress and comfortable.   Alone. EYES: no pallor or icterus OROPHARYNX: no thrush or ulceration; dentures. NECK: supple, no masses felt LYMPH:  no palpable lymphadenopathy in the cervical, axillary or inguinal regions LUNGS: bil decreased air entry; No wheeze or crackles HEART/CVS: regular rate & rhythm and no murmurs; No lower extremity edema ABDOMEN: abdomen soft, non-tender and normal bowel sounds Musculoskeletal:no cyanosis of digits and no  clubbing  PSYCH: alert & oriented x 3 with fluent speech NEURO: no focal motor/sensory deficits SKIN:  no rashes or significant lesions    ASSESSMENT & PLAN:   Polycythemia vera ([facility]) # Polycythemia vera/essential thrombocytosis- JAK-2 POSITIVE; hydrea 500 BID. Patient's platelets have significantly improved from 700 currently 450.  # check H&H q 4 weeks/ continue phlebotomy if hematocrit greater than 43 . Continue aspirin  # SMOKING-also discussed smoking cessation. I discussed lung cancer screening program ; however he Wants to hold off Ct screening program.   # follow up in 6 months/labs/ phlebotomy.        Cammie Sickle, MD 05/06/2016 11:09 AM

## 2016-05-04 NOTE — Assessment & Plan Note (Addendum)
#   Polycythemia vera/essential thrombocytosis- JAK-2 POSITIVE; hydrea 500 BID. Patient's platelets have significantly improved from 700 currently 450.  # check H&H q 4 weeks/ continue phlebotomy if hematocrit greater than 43 . Continue aspirin  # SMOKING-also discussed smoking cessation. I discussed lung cancer screening program ; however he Wants to hold off Ct screening program.   # follow up in 6 months/labs/ phlebotomy.

## 2016-06-01 ENCOUNTER — Inpatient Hospital Stay: Payer: BLUE CROSS/BLUE SHIELD

## 2016-06-08 ENCOUNTER — Inpatient Hospital Stay: Payer: BLUE CROSS/BLUE SHIELD | Attending: Internal Medicine

## 2016-06-08 ENCOUNTER — Inpatient Hospital Stay: Payer: BLUE CROSS/BLUE SHIELD

## 2016-06-08 VITALS — BP 128/75 | HR 86 | Temp 98.8°F | Resp 18

## 2016-06-08 DIAGNOSIS — F17210 Nicotine dependence, cigarettes, uncomplicated: Secondary | ICD-10-CM | POA: Insufficient documentation

## 2016-06-08 DIAGNOSIS — D473 Essential (hemorrhagic) thrombocythemia: Secondary | ICD-10-CM | POA: Insufficient documentation

## 2016-06-08 DIAGNOSIS — D45 Polycythemia vera: Secondary | ICD-10-CM | POA: Insufficient documentation

## 2016-06-08 DIAGNOSIS — Z79899 Other long term (current) drug therapy: Secondary | ICD-10-CM | POA: Insufficient documentation

## 2016-06-08 LAB — HEMATOCRIT: HEMATOCRIT: 44.8 % (ref 40.0–52.0)

## 2016-06-08 LAB — HEMOGLOBIN: Hemoglobin: 15.5 g/dL (ref 13.0–18.0)

## 2016-06-29 ENCOUNTER — Inpatient Hospital Stay: Payer: BLUE CROSS/BLUE SHIELD

## 2016-06-29 ENCOUNTER — Inpatient Hospital Stay: Payer: BLUE CROSS/BLUE SHIELD | Attending: Internal Medicine

## 2016-06-29 DIAGNOSIS — Z79899 Other long term (current) drug therapy: Secondary | ICD-10-CM | POA: Diagnosis not present

## 2016-06-29 DIAGNOSIS — D45 Polycythemia vera: Secondary | ICD-10-CM | POA: Diagnosis not present

## 2016-06-29 DIAGNOSIS — F17210 Nicotine dependence, cigarettes, uncomplicated: Secondary | ICD-10-CM | POA: Diagnosis not present

## 2016-06-29 DIAGNOSIS — D473 Essential (hemorrhagic) thrombocythemia: Secondary | ICD-10-CM | POA: Insufficient documentation

## 2016-06-29 LAB — HEMATOCRIT: HCT: 41.9 % (ref 40.0–52.0)

## 2016-06-29 LAB — HEMOGLOBIN: HEMOGLOBIN: 14.5 g/dL (ref 13.0–18.0)

## 2016-07-27 ENCOUNTER — Inpatient Hospital Stay: Payer: BLUE CROSS/BLUE SHIELD

## 2016-07-31 ENCOUNTER — Inpatient Hospital Stay: Payer: BLUE CROSS/BLUE SHIELD

## 2016-07-31 ENCOUNTER — Inpatient Hospital Stay: Payer: BLUE CROSS/BLUE SHIELD | Attending: Internal Medicine

## 2016-07-31 ENCOUNTER — Other Ambulatory Visit: Payer: Self-pay

## 2016-07-31 DIAGNOSIS — D45 Polycythemia vera: Secondary | ICD-10-CM

## 2016-07-31 DIAGNOSIS — Z79899 Other long term (current) drug therapy: Secondary | ICD-10-CM | POA: Insufficient documentation

## 2016-07-31 DIAGNOSIS — F17210 Nicotine dependence, cigarettes, uncomplicated: Secondary | ICD-10-CM | POA: Insufficient documentation

## 2016-07-31 DIAGNOSIS — D473 Essential (hemorrhagic) thrombocythemia: Secondary | ICD-10-CM | POA: Insufficient documentation

## 2016-07-31 LAB — HEMATOCRIT: HCT: 44.4 % (ref 40.0–52.0)

## 2016-07-31 LAB — HEMOGLOBIN: HEMOGLOBIN: 15.4 g/dL (ref 13.0–18.0)

## 2016-08-24 ENCOUNTER — Inpatient Hospital Stay: Payer: BLUE CROSS/BLUE SHIELD

## 2016-08-24 ENCOUNTER — Inpatient Hospital Stay: Payer: BLUE CROSS/BLUE SHIELD | Attending: Internal Medicine

## 2016-08-24 VITALS — BP 136/88 | HR 81 | Temp 98.3°F | Resp 20

## 2016-08-24 DIAGNOSIS — D45 Polycythemia vera: Secondary | ICD-10-CM

## 2016-08-24 LAB — HEMATOCRIT: HCT: 44.1 % (ref 40.0–52.0)

## 2016-08-24 LAB — HEMOGLOBIN: Hemoglobin: 15 g/dL (ref 13.0–18.0)

## 2016-09-21 ENCOUNTER — Inpatient Hospital Stay: Payer: BLUE CROSS/BLUE SHIELD

## 2016-09-21 ENCOUNTER — Inpatient Hospital Stay: Payer: BLUE CROSS/BLUE SHIELD | Attending: Internal Medicine

## 2016-10-19 ENCOUNTER — Inpatient Hospital Stay: Payer: Medicare Other

## 2016-11-01 ENCOUNTER — Other Ambulatory Visit: Payer: BLUE CROSS/BLUE SHIELD

## 2016-11-01 ENCOUNTER — Ambulatory Visit: Payer: BLUE CROSS/BLUE SHIELD | Admitting: Internal Medicine

## 2016-11-02 ENCOUNTER — Inpatient Hospital Stay: Payer: Medicare Other

## 2016-11-02 ENCOUNTER — Inpatient Hospital Stay: Payer: Medicare Other | Attending: Internal Medicine | Admitting: Internal Medicine

## 2016-11-02 VITALS — BP 144/72 | HR 88 | Temp 98.5°F | Resp 18 | Wt 181.5 lb

## 2016-11-02 VITALS — BP 139/76 | HR 78 | Resp 18

## 2016-11-02 DIAGNOSIS — Z79899 Other long term (current) drug therapy: Secondary | ICD-10-CM

## 2016-11-02 DIAGNOSIS — D45 Polycythemia vera: Secondary | ICD-10-CM

## 2016-11-02 DIAGNOSIS — F17210 Nicotine dependence, cigarettes, uncomplicated: Secondary | ICD-10-CM | POA: Insufficient documentation

## 2016-11-02 DIAGNOSIS — D473 Essential (hemorrhagic) thrombocythemia: Secondary | ICD-10-CM

## 2016-11-02 LAB — CBC WITH DIFFERENTIAL/PLATELET
Basophils Absolute: 0.1 10*3/uL (ref 0–0.1)
Basophils Relative: 1 %
EOS ABS: 0.1 10*3/uL (ref 0–0.7)
EOS PCT: 2 %
HCT: 44.2 % (ref 40.0–52.0)
Hemoglobin: 15.2 g/dL (ref 13.0–18.0)
LYMPHS PCT: 19 %
Lymphs Abs: 1.7 10*3/uL (ref 1.0–3.6)
MCH: 35.3 pg — ABNORMAL HIGH (ref 26.0–34.0)
MCHC: 34.3 g/dL (ref 32.0–36.0)
MCV: 102.8 fL — ABNORMAL HIGH (ref 80.0–100.0)
Monocytes Absolute: 0.6 10*3/uL (ref 0.2–1.0)
Monocytes Relative: 7 %
NEUTROS PCT: 71 %
Neutro Abs: 6.6 10*3/uL — ABNORMAL HIGH (ref 1.4–6.5)
PLATELETS: 629 10*3/uL — AB (ref 150–440)
RBC: 4.3 MIL/uL — AB (ref 4.40–5.90)
RDW: 14.8 % — ABNORMAL HIGH (ref 11.5–14.5)
WBC: 9.1 10*3/uL (ref 3.8–10.6)

## 2016-11-02 LAB — COMPREHENSIVE METABOLIC PANEL
ALT: 17 U/L (ref 17–63)
AST: 26 U/L (ref 15–41)
Albumin: 3.7 g/dL (ref 3.5–5.0)
Alkaline Phosphatase: 92 U/L (ref 38–126)
Anion gap: 6 (ref 5–15)
BUN: 26 mg/dL — AB (ref 6–20)
CHLORIDE: 107 mmol/L (ref 101–111)
CO2: 25 mmol/L (ref 22–32)
CREATININE: 1.19 mg/dL (ref 0.61–1.24)
Calcium: 8.7 mg/dL — ABNORMAL LOW (ref 8.9–10.3)
GFR calc Af Amer: >60 mL/min (ref >60)
GFR calc non Af Amer: >60 mL/min (ref >60)
GLUCOSE: 150 mg/dL — AB (ref 65–99)
Potassium: 3.9 mmol/L (ref 3.5–5.1)
SODIUM: 138 mmol/L (ref 135–145)
Total Bilirubin: 0.7 mg/dL (ref 0.3–1.2)
Total Protein: 6.6 g/dL (ref 6.5–8.1)

## 2016-11-02 NOTE — Progress Notes (Signed)
[facility] NOTE  Patient Care Team: Maryland Pink, MD as PCP - General (Family Medicine)  CHIEF COMPLAINTS/PURPOSE OF CONSULTATION:   # April 2017- ESSENTIAL THROMBOCYTOSIS/-POLYCYTHEMIA VERA- JAK-2 POSITIVE [HCT 52.9/platelets- 983]; Recom phlebotomy/ Hydrea 500 BID / Aspirin 325 mg a day  # Smoker- Recm quitting  HISTORY OF PRESENTING ILLNESS:  Jonathan Evans 66 y.o.  male  history of [name] 2 positive polycythemia vera/ET currently on Hydrea 500 mg twice a day; and also need phlebotomy every 2 months or so is here for follow-up.  Unfortunately continues to smoke.Patient denies any significant headaches. Denies any chest pain or shortness of breath. His chronic mild tingling and numbness in his feet. Not any worse. He is an aspirin.   ROS: A complete 10 point review of system is done which is negative except mentioned above in history of present illness  MEDICAL HISTORY:  Past Medical History:  Diagnosis Date  . Polycythemia vera ([place])   . Thrombocytosis ([place])   . Tobacco abuse     SURGICAL HISTORY: Past Surgical History:  Procedure Laterality Date  . AV FISTULA REPAIR    . KNEE ARTHROSCOPY W/ MENISCAL REPAIR    . 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  Denies any alcohol. Social History   Social History  . Marital status: Married    Spouse name: N/A  . Number of children: N/A  . Years of education: N/A   Occupational History  . Not on file.   Social History Main Topics  . Smoking status: Current Every Day Smoker    Packs/day: 1.50    Years: 30.00    Types: Cigarettes  . Smokeless tobacco: Never Used     Comment: uses e-cig  . Alcohol use No     Comment: rarely  . Drug use: No  . Sexual activity: Not on file   Other Topics Concern  . Not on file   Social History Narrative  . No narrative on file    FAMILY HISTORY: dad- stomach cancer- 38s.  Family History  Problem Relation Age of  Onset  . Stomach cancer Father     ALLERGIES:  has No Known Allergies.  MEDICATIONS:  Current Outpatient Prescriptions  Medication Sig Dispense Refill  . aspirin EC 81 MG tablet Take 81 mg by mouth daily.    . diclofenac (VOLTAREN) 75 MG EC tablet Take 1 tablet by mouth daily.  1  . hydroxyurea (HYDREA) 500 MG capsule Take 1 capsule (500 mg total) by mouth 2 (two) times daily. May take with food to minimize GI side effects. 60 capsule 3  . ibuprofen (ADVIL,MOTRIN) 200 MG tablet Take 200 mg by mouth every 6 (six) hours as needed.    . venlafaxine XR (EFFEXOR-XR) 75 MG 24 hr capsule Take by mouth.     No current facility-administered medications for this visit.       Marland Kitchen  PHYSICAL EXAMINATION: ECOG PERFORMANCE STATUS: 0 - Asymptomatic  Vitals:   11/02/16 1406  BP: (!) 144/72  Pulse: 88  Resp: 18  Temp: 98.5 F (36.9 C)   Filed Weights   11/02/16 1406  Weight: 181 lb 8 oz (82.3 kg)    GENERAL: Well-nourished well-developed; Alert, no distress and comfortable.   Alone. EYES: no pallor or icterus OROPHARYNX: no thrush or ulceration; dentures. NECK: supple, no masses felt LYMPH:  no palpable lymphadenopathy in the cervical, axillary or inguinal regions LUNGS: bil  decreased air entry; No wheeze or crackles HEART/CVS: regular rate & rhythm and no murmurs; No lower extremity edema ABDOMEN: abdomen soft, non-tender and normal bowel sounds Musculoskeletal:no cyanosis of digits and no clubbing  PSYCH: alert & oriented x 3 with fluent speech NEURO: no focal motor/sensory deficits SKIN:  no rashes or significant lesions    ASSESSMENT & PLAN:   Polycythemia vera (HCC) # Polycythemia vera/essential thrombocytosis- JAK-2 POSITIVE; hydrea 500 BID. Patient's platelets today-  625.  # check H&H q 8 weeks/ continue phlebotomy if hematocrit greater than 43 . Continue aspirin  # SMOKING- Also discussed smoking cessation.smoking- cessation discussed; cutting down.     # follow up  in 6 months/labs/ phlebotomy.      Cammie Sickle, MD 11/03/2016 12:15 PM

## 2016-11-02 NOTE — Progress Notes (Signed)
Patient here today for follow up.  Patient states no new concerns today  

## 2016-11-02 NOTE — Assessment & Plan Note (Addendum)
#   Polycythemia vera/essential thrombocytosis- JAK-2 POSITIVE; hydrea 500 BID. Patient's platelets today-  625.  # check H&H q 8 weeks/ continue phlebotomy if hematocrit greater than 43 . Continue aspirin  # SMOKING- Also discussed smoking cessation.smoking- cessation discussed; cutting down.     # follow up in 6 months/labs/ phlebotomy.

## 2017-01-02 ENCOUNTER — Inpatient Hospital Stay: Payer: Medicare Other

## 2017-01-02 ENCOUNTER — Inpatient Hospital Stay: Payer: Medicare Other | Attending: Internal Medicine

## 2017-01-02 VITALS — BP 114/77 | HR 77 | Temp 98.1°F | Resp 18

## 2017-01-02 DIAGNOSIS — D473 Essential (hemorrhagic) thrombocythemia: Secondary | ICD-10-CM | POA: Insufficient documentation

## 2017-01-02 DIAGNOSIS — D45 Polycythemia vera: Secondary | ICD-10-CM

## 2017-01-02 DIAGNOSIS — Z79899 Other long term (current) drug therapy: Secondary | ICD-10-CM | POA: Diagnosis not present

## 2017-01-02 DIAGNOSIS — F17210 Nicotine dependence, cigarettes, uncomplicated: Secondary | ICD-10-CM | POA: Diagnosis not present

## 2017-01-02 LAB — HEMOGLOBIN: Hemoglobin: 15.6 g/dL (ref 13.0–18.0)

## 2017-01-02 LAB — HEMATOCRIT: HCT: 45.6 % (ref 40.0–52.0)

## 2017-03-04 ENCOUNTER — Inpatient Hospital Stay: Payer: Medicare Other | Attending: Internal Medicine

## 2017-03-04 ENCOUNTER — Inpatient Hospital Stay: Payer: Medicare Other

## 2017-03-04 VITALS — BP 123/79 | HR 76 | Temp 97.8°F | Resp 20

## 2017-03-04 DIAGNOSIS — D45 Polycythemia vera: Secondary | ICD-10-CM | POA: Diagnosis not present

## 2017-03-04 LAB — HEMOGLOBIN: HEMOGLOBIN: 15.8 g/dL (ref 13.0–18.0)

## 2017-03-04 LAB — HEMATOCRIT: HEMATOCRIT: 45.9 % (ref 40.0–52.0)

## 2017-03-07 ENCOUNTER — Other Ambulatory Visit: Payer: Self-pay | Admitting: Internal Medicine

## 2017-03-07 DIAGNOSIS — D45 Polycythemia vera: Secondary | ICD-10-CM

## 2017-03-07 DIAGNOSIS — D473 Essential (hemorrhagic) thrombocythemia: Secondary | ICD-10-CM

## 2017-03-07 NOTE — Telephone Encounter (Signed)
There is a dosing difference from your note and what is requested form pharmacy. PLease advise   Polycythemia vera ([place]) A&P Encounter Date: 11/02/2016 Cammie Sickle, MD  Oncology    # Polycythemia vera/essential thrombocytosis- JAK-2 POSITIVE; hydrea 500 BID. Patient's platelets today-  625.

## 2017-05-06 ENCOUNTER — Inpatient Hospital Stay: Payer: Medicare Other | Attending: Internal Medicine

## 2017-05-06 ENCOUNTER — Other Ambulatory Visit: Payer: Self-pay

## 2017-05-06 ENCOUNTER — Inpatient Hospital Stay: Payer: Medicare Other

## 2017-05-06 ENCOUNTER — Inpatient Hospital Stay (HOSPITAL_BASED_OUTPATIENT_CLINIC_OR_DEPARTMENT_OTHER): Payer: Medicare Other | Admitting: Internal Medicine

## 2017-05-06 VITALS — BP 150/81 | HR 98 | Temp 93.0°F | Resp 16 | Wt 179.0 lb

## 2017-05-06 VITALS — BP 122/73 | HR 77 | Resp 18

## 2017-05-06 DIAGNOSIS — R200 Anesthesia of skin: Secondary | ICD-10-CM | POA: Insufficient documentation

## 2017-05-06 DIAGNOSIS — R202 Paresthesia of skin: Secondary | ICD-10-CM | POA: Insufficient documentation

## 2017-05-06 DIAGNOSIS — Z7982 Long term (current) use of aspirin: Secondary | ICD-10-CM

## 2017-05-06 DIAGNOSIS — F17210 Nicotine dependence, cigarettes, uncomplicated: Secondary | ICD-10-CM

## 2017-05-06 DIAGNOSIS — D45 Polycythemia vera: Secondary | ICD-10-CM

## 2017-05-06 DIAGNOSIS — Z79899 Other long term (current) drug therapy: Secondary | ICD-10-CM | POA: Insufficient documentation

## 2017-05-06 DIAGNOSIS — D473 Essential (hemorrhagic) thrombocythemia: Secondary | ICD-10-CM

## 2017-05-06 LAB — HEMOGLOBIN: Hemoglobin: 15.7 g/dL (ref 13.0–18.0)

## 2017-05-06 LAB — HEMATOCRIT: HEMATOCRIT: 45.3 % (ref 40.0–52.0)

## 2017-05-06 MED ORDER — HYDROXYUREA 500 MG PO CAPS: 1000.0000 mg | ORAL_CAPSULE | Freq: Every day | ORAL | 3 refills | Status: DC

## 2017-05-06 NOTE — Progress Notes (Signed)
[facility] NOTE  Patient Care Team: Maryland Pink, MD as PCP - General (Family Medicine)  CHIEF COMPLAINTS/PURPOSE OF CONSULTATION:   # April 2017- ESSENTIAL THROMBOCYTOSIS/-POLYCYTHEMIA VERA- JAK-2 POSITIVE [HCT 52.9/platelets- 154]; Recom phlebotomy/ Hydrea 500 BID / Aspirin 325 mg a day  # Smoker- Recm quitting  HISTORY OF PRESENTING ILLNESS:  Jonathan Evans 66 y.o.  male  history of [name] 2 positive polycythemia vera/ET currently on Hydrea 500 mg once a day. He has been needing therapeutic phlebotomy every 30 days and returns to clinic today for follow-up. He says he plans to partially retire next month.   Unfortunately, he continues to smoke. Denies headaches, chest pain, sob. He has mild, chronic tingling and numbness in his fingertips and toes. It is stable and unchanged. He continues aspirin 325 mg daily.   ROS: A complete 10 point review of system is done which is negative except mentioned above in history of present illness  MEDICAL HISTORY:  Past Medical History:  Diagnosis Date  . Polycythemia vera ([place])   . Thrombocytosis ([place])   . Tobacco abuse     SURGICAL HISTORY: Past Surgical History:  Procedure Laterality Date  . AV FISTULA REPAIR    . KNEE ARTHROSCOPY W/ MENISCAL REPAIR    . 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 Social History   Social History  . Marital status: Married    Spouse name: N/A  . Number of children: N/A  . Years of education: N/A   Occupational History  . Not on file.   Social History Main Topics  . Smoking status: Current Every Day Smoker    Packs/day: 1.50    Years: 30.00    Types: Cigarettes  . Smokeless tobacco: Never Used     Comment: uses e-cig  . Alcohol use No     Comment: rarely  . Drug use: No  . Sexual activity: Not on file   Other Topics Concern  . Not on file   Social History Narrative  . No narrative on file     FAMILY HISTORY: dad- stomach cancer- 50s.  Family History  Problem Relation Age of Onset  . Stomach cancer Father     ALLERGIES:  has No Known Allergies.  MEDICATIONS:  Current Outpatient Prescriptions  Medication Sig Dispense Refill  . aspirin EC 81 MG tablet Take 81 mg by mouth daily.    . diclofenac (VOLTAREN) 75 MG EC tablet Take 1 tablet by mouth daily.  1  . hydroxyurea (HYDREA) 500 MG capsule Take 2 capsules (1,000 mg total) by mouth daily. 60 capsule 3  . ibuprofen (ADVIL,MOTRIN) 200 MG tablet Take 200 mg by mouth every 6 (six) hours as needed.    . venlafaxine XR (EFFEXOR-XR) 75 MG 24 hr capsule Take by mouth.     No current facility-administered medications for this visit.    PHYSICAL EXAMINATION: ECOG PERFORMANCE STATUS: 0 - Asymptomatic  Vitals:   05/06/17 1404  BP: (!) 150/81  Pulse: 98  Resp: 16  Temp: (!) 93 F (33.9 C)   Filed Weights   05/06/17 1404  Weight: 179 lb (81.2 kg)   CBC Latest Ref Rng & Units 05/06/2017 03/04/2017 01/02/2017  WBC 3.8 - 10.6 K/uL - - -  Hemoglobin 13.0 - 18.0 g/dL 15.7 15.8 15.6  Hematocrit 40.0 - 52.0 % 45.3 45.9 45.6  Platelets 150 - 440 K/uL - - -  CMP Latest Ref Rng & Units 11/02/2016 05/04/2016 02/02/2016  Glucose 65 - 99 mg/dL 150(H) 105(H) 145(H)  BUN 6 - 20 mg/dL 26(H) 23(H) 27(H)  Creatinine 0.61 - 1.24 mg/dL 1.19 1.09 1.15  Sodium 135 - 145 mmol/L 138 137 141  Potassium 3.5 - 5.1 mmol/L 3.9 4.0 3.9  Chloride 101 - 111 mmol/L 107 108 108  CO2 22 - 32 mmol/L 25 24 23   Calcium 8.9 - 10.3 mg/dL 8.7(L) 8.7(L) 8.9  Total Protein 6.5 - 8.1 g/dL 6.6 6.4(L) 6.3(L)  Total Bilirubin 0.3 - 1.2 mg/dL 0.7 0.7 0.7  Alkaline Phos 38 - 126 U/L 92 94 99  AST 15 - 41 U/L 26 17 20   ALT 17 - 63 U/L 17 16(L) 20     GENERAL: Well-nourished well-developed; Alert, no distress and comfortable.   Alone. EYES: no pallor or icterus OROPHARYNX: no thrush or ulceration; dentures. NECK: supple, no masses felt LYMPH:  no palpable  lymphadenopathy in the cervical, axillary or inguinal regions LUNGS: bil decreased air entry; No wheeze or crackles HEART/CVS: regular rate & rhythm and no murmurs; No lower extremity edema ABDOMEN: abdomen soft, non-tender and normal bowel sounds Musculoskeletal:no cyanosis of digits and no clubbing  PSYCH: alert & oriented x 3 with fluent speech NEURO: no focal motor/sensory deficits SKIN:  no rashes or significant lesions  ASSESSMENT & PLAN:   Polycythemia vera (HCC) # Polycythemia vera/essential thrombocytosis- JAK-2 POSITIVE. Patient's platelet count has been consistently increased. Will increase hydrea from 500mg  qd to 1000mg  qd. Hemogloin 15.7 today, hematocrit 45.3. Patient has been receiving therapeutic phlebotomy if hematocrit greater than 43 with a goal of hematocrit <45. Patient continues aspirin 325 mg qd.   # SMOKING- Also discussed smoking cessation.smoking- cessation discussed;patient continues to smoke but says he is trying to reduce his smoking. Low dose CT lung screening due in December. Will refer to get scheduled.   # will plan for phlebotomy today with labs and phlebotomy monthly then see MD again in 3 months for labs and phlebotomy.    Jonathan Hemenway G. Zenia Resides, NP 05/06/17 4:45 PM    Jonathan Au, NP 05/06/2017 4:45 PM

## 2017-05-06 NOTE — Assessment & Plan Note (Addendum)
#   Polycythemia vera/essential thrombocytosis- JAK-2 POSITIVE. Patient's platelet count has been consistently increased. Will increase hydrea from 500mg  qd to 1000mg  qd. Hemogloin 15.7 today, hematocrit 45.3. Patient has been receiving therapeutic phlebotomy if hematocrit greater than 43 with a goal of hematocrit <45. Patient continues aspirin 325 mg qd.   # SMOKING- Also discussed smoking cessation.smoking- cessation discussed;patient continues to smoke but says he is trying to reduce his smoking. Low dose CT lung screening due in December. Will refer to get scheduled.   # will plan for phlebotomy today with labs and phlebotomy monthly then see MD again in 3 months for labs and phlebotomy.

## 2017-06-05 ENCOUNTER — Inpatient Hospital Stay: Payer: Medicare Other | Attending: Internal Medicine

## 2017-06-05 ENCOUNTER — Inpatient Hospital Stay: Payer: Medicare Other

## 2017-06-05 DIAGNOSIS — D45 Polycythemia vera: Secondary | ICD-10-CM | POA: Insufficient documentation

## 2017-06-05 DIAGNOSIS — F17210 Nicotine dependence, cigarettes, uncomplicated: Secondary | ICD-10-CM | POA: Diagnosis not present

## 2017-06-05 DIAGNOSIS — R200 Anesthesia of skin: Secondary | ICD-10-CM | POA: Diagnosis not present

## 2017-06-05 DIAGNOSIS — Z79899 Other long term (current) drug therapy: Secondary | ICD-10-CM | POA: Insufficient documentation

## 2017-06-05 DIAGNOSIS — Z7982 Long term (current) use of aspirin: Secondary | ICD-10-CM | POA: Insufficient documentation

## 2017-06-05 DIAGNOSIS — R202 Paresthesia of skin: Secondary | ICD-10-CM | POA: Diagnosis not present

## 2017-06-05 LAB — CBC WITH DIFFERENTIAL/PLATELET
Basophils Absolute: 0 10*3/uL (ref 0–0.1)
Basophils Relative: 0 %
EOS ABS: 0.1 10*3/uL (ref 0–0.7)
EOS PCT: 1 %
HCT: 42 % (ref 40.0–52.0)
Hemoglobin: 14.3 g/dL (ref 13.0–18.0)
LYMPHS ABS: 1.4 10*3/uL (ref 1.0–3.6)
Lymphocytes Relative: 19 %
MCH: 35.5 pg — AB (ref 26.0–34.0)
MCHC: 34.2 g/dL (ref 32.0–36.0)
MCV: 103.7 fL — ABNORMAL HIGH (ref 80.0–100.0)
MONO ABS: 0.5 10*3/uL (ref 0.2–1.0)
Monocytes Relative: 6 %
Neutro Abs: 5.6 10*3/uL (ref 1.4–6.5)
Neutrophils Relative %: 74 %
PLATELETS: 375 10*3/uL (ref 150–440)
RBC: 4.05 MIL/uL — AB (ref 4.40–5.90)
RDW: 17.5 % — AB (ref 11.5–14.5)
WBC: 7.6 10*3/uL (ref 3.8–10.6)

## 2017-06-13 DIAGNOSIS — Z125 Encounter for screening for malignant neoplasm of prostate: Secondary | ICD-10-CM | POA: Diagnosis not present

## 2017-06-13 DIAGNOSIS — D473 Essential (hemorrhagic) thrombocythemia: Secondary | ICD-10-CM | POA: Diagnosis not present

## 2017-06-13 DIAGNOSIS — Z0000 Encounter for general adult medical examination without abnormal findings: Secondary | ICD-10-CM | POA: Diagnosis not present

## 2017-07-31 DIAGNOSIS — Z125 Encounter for screening for malignant neoplasm of prostate: Secondary | ICD-10-CM | POA: Diagnosis not present

## 2017-07-31 DIAGNOSIS — R319 Hematuria, unspecified: Secondary | ICD-10-CM | POA: Diagnosis not present

## 2017-07-31 DIAGNOSIS — D473 Essential (hemorrhagic) thrombocythemia: Secondary | ICD-10-CM | POA: Diagnosis not present

## 2017-07-31 DIAGNOSIS — D45 Polycythemia vera: Secondary | ICD-10-CM | POA: Diagnosis not present

## 2017-07-31 DIAGNOSIS — Z0000 Encounter for general adult medical examination without abnormal findings: Secondary | ICD-10-CM | POA: Diagnosis not present

## 2017-07-31 DIAGNOSIS — Z1211 Encounter for screening for malignant neoplasm of colon: Secondary | ICD-10-CM | POA: Diagnosis not present

## 2017-07-31 DIAGNOSIS — R799 Abnormal finding of blood chemistry, unspecified: Secondary | ICD-10-CM | POA: Diagnosis not present

## 2017-07-31 DIAGNOSIS — F17200 Nicotine dependence, unspecified, uncomplicated: Secondary | ICD-10-CM | POA: Diagnosis not present

## 2017-07-31 DIAGNOSIS — M25511 Pain in right shoulder: Secondary | ICD-10-CM | POA: Diagnosis not present

## 2017-08-05 ENCOUNTER — Inpatient Hospital Stay: Payer: Medicare Other | Attending: Internal Medicine | Admitting: Internal Medicine

## 2017-08-05 ENCOUNTER — Encounter: Payer: Self-pay | Admitting: Internal Medicine

## 2017-08-05 ENCOUNTER — Inpatient Hospital Stay: Payer: Medicare Other

## 2017-08-05 ENCOUNTER — Other Ambulatory Visit: Payer: Self-pay | Admitting: Hematology and Oncology

## 2017-08-05 VITALS — BP 152/82 | HR 79 | Temp 97.6°F | Resp 16 | Wt 179.0 lb

## 2017-08-05 DIAGNOSIS — F17210 Nicotine dependence, cigarettes, uncomplicated: Secondary | ICD-10-CM | POA: Diagnosis not present

## 2017-08-05 DIAGNOSIS — D45 Polycythemia vera: Secondary | ICD-10-CM

## 2017-08-05 DIAGNOSIS — Z79899 Other long term (current) drug therapy: Secondary | ICD-10-CM | POA: Diagnosis not present

## 2017-08-05 DIAGNOSIS — D473 Essential (hemorrhagic) thrombocythemia: Secondary | ICD-10-CM | POA: Diagnosis not present

## 2017-08-05 DIAGNOSIS — R319 Hematuria, unspecified: Secondary | ICD-10-CM

## 2017-08-05 DIAGNOSIS — Z7982 Long term (current) use of aspirin: Secondary | ICD-10-CM | POA: Insufficient documentation

## 2017-08-05 LAB — CBC WITH DIFFERENTIAL/PLATELET
Basophils Absolute: 0 10*3/uL (ref 0–0.1)
Basophils Relative: 1 %
EOS ABS: 0.1 10*3/uL (ref 0–0.7)
Eosinophils Relative: 2 %
HEMATOCRIT: 40.3 % (ref 40.0–52.0)
Hemoglobin: 13.7 g/dL (ref 13.0–18.0)
LYMPHS ABS: 1.7 10*3/uL (ref 1.0–3.6)
Lymphocytes Relative: 33 %
MCH: 38.3 pg — AB (ref 26.0–34.0)
MCHC: 33.9 g/dL (ref 32.0–36.0)
MCV: 112.9 fL — AB (ref 80.0–100.0)
MONOS PCT: 7 %
Monocytes Absolute: 0.3 10*3/uL (ref 0.2–1.0)
NEUTROS PCT: 57 %
Neutro Abs: 2.9 10*3/uL (ref 1.4–6.5)
Platelets: 310 10*3/uL (ref 150–440)
RBC: 3.57 MIL/uL — ABNORMAL LOW (ref 4.40–5.90)
RDW: 20.8 % — ABNORMAL HIGH (ref 11.5–14.5)
WBC: 5.1 10*3/uL (ref 3.8–10.6)

## 2017-08-05 LAB — COMPREHENSIVE METABOLIC PANEL
ALK PHOS: 94 U/L (ref 38–126)
ALT: 14 U/L — ABNORMAL LOW (ref 17–63)
ANION GAP: 7 (ref 5–15)
AST: 16 U/L (ref 15–41)
Albumin: 3.6 g/dL (ref 3.5–5.0)
BILIRUBIN TOTAL: 0.7 mg/dL (ref 0.3–1.2)
BUN: 25 mg/dL — ABNORMAL HIGH (ref 6–20)
CALCIUM: 8.9 mg/dL (ref 8.9–10.3)
CO2: 28 mmol/L (ref 22–32)
Chloride: 105 mmol/L (ref 101–111)
Creatinine, Ser: 1.2 mg/dL (ref 0.61–1.24)
Glucose, Bld: 121 mg/dL — ABNORMAL HIGH (ref 65–99)
Potassium: 4.1 mmol/L (ref 3.5–5.1)
SODIUM: 140 mmol/L (ref 135–145)
TOTAL PROTEIN: 6.2 g/dL — AB (ref 6.5–8.1)

## 2017-08-05 NOTE — Progress Notes (Signed)
[facility] NOTE  Patient Care Team: Maryland Pink, MD as PCP - General (Family Medicine)  CHIEF COMPLAINTS/PURPOSE OF CONSULTATION:   # April 2017- ESSENTIAL THROMBOCYTOSIS/-POLYCYTHEMIA VERA- JAK-2 POSITIVE [HCT 52.9/platelets- 433]; Recom phlebotomy/ Hydrea 500 BID / Aspirin 325 mg a day  # Smoker- Recm quitting  HISTORY OF PRESENTING ILLNESS:  Jonathan Evans 66 y.o.  male  history of [name] 2 positive polycythemia vera/ET currently on Hydrea 500 mg twice a day; and also need phlebotomy every 2 months or so is here for follow-up.  Patient stated that he was noted to have microscopic blood in urine-he is awaiting urology evaluation.  Patient denies any headaches or thromboembolic events.  Unfortunately continues to smoke.  Denies any chest pain or shortness of breath. His chronic mild tingling and numbness in his feet. Not any worse. He is an aspirin.   ROS: A complete 10 point review of system is done which is negative except mentioned above in history of present illness  MEDICAL HISTORY:  Past Medical History:  Diagnosis Date  . Polycythemia vera ([place])   . Thrombocytosis ([place])   . Tobacco abuse     SURGICAL HISTORY: Past Surgical History:  Procedure Laterality Date  . AV FISTULA REPAIR    . KNEE ARTHROSCOPY W/ MENISCAL REPAIR    . 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 Social History   Socioeconomic History  . Marital status: Married    Spouse name: Not on file  . Number of children: Not on file  . Years of education: Not on file  . Highest education level: Not on file  Social Needs  . Financial resource strain: Not on file  . Food insecurity - worry: Not on file  . Food insecurity - inability: Not on file  . Transportation needs - medical: Not on file  . Transportation needs - non-medical: Not on file  Occupational History  . Not on file  Tobacco Use  . Smoking status:  Current Every Day Smoker    Packs/day: 1.50    Years: 30.00    Pack years: 45.00    Types: Cigarettes  . Smokeless tobacco: Never Used  . Tobacco comment: uses e-cig  Substance and Sexual Activity  . Alcohol use: No    Alcohol/week: 0.0 oz    Comment: rarely  . Drug use: No  . Sexual activity: Not on file  Other Topics Concern  . Not on file  Social History Narrative  . Not on file    FAMILY HISTORY: dad- stomach cancer- 68s.  Family History  Problem Relation Age of Onset  . Stomach cancer Father     ALLERGIES:  has No Known Allergies.  MEDICATIONS:  Current Outpatient Medications  Medication Sig Dispense Refill  . aspirin EC 81 MG tablet Take 81 mg by mouth daily.    . diclofenac (VOLTAREN) 75 MG EC tablet Take 1 tablet by mouth daily.  1  . hydroxyurea (HYDREA) 500 MG capsule Take 2 capsules (1,000 mg total) by mouth daily. 60 capsule 3  . ibuprofen (ADVIL,MOTRIN) 200 MG tablet Take 200 mg by mouth every 6 (six) hours as needed.    . venlafaxine XR (EFFEXOR-XR) 75 MG 24 hr capsule Take by mouth.     No current facility-administered medications for this visit.       Marland Kitchen  PHYSICAL EXAMINATION: ECOG PERFORMANCE STATUS: 0 - Asymptomatic  Vitals:  08/05/17 1409  BP: (!) 152/82  Pulse: 79  Resp: 16  Temp: 97.6 F (36.4 C)   Filed Weights   08/05/17 1409  Weight: 179 lb (81.2 kg)    GENERAL: Well-nourished well-developed; Alert, no distress and comfortable.   Alone. EYES: no pallor or icterus OROPHARYNX: no thrush or ulceration; dentures. NECK: supple, no masses felt LYMPH:  no palpable lymphadenopathy in the cervical, axillary or inguinal regions LUNGS: bil decreased air entry; No wheeze or crackles HEART/CVS: regular rate & rhythm and no murmurs; No lower extremity edema ABDOMEN: abdomen soft, non-tender and normal bowel sounds Musculoskeletal:no cyanosis of digits and no clubbing  PSYCH: alert & oriented x 3 with fluent speech NEURO: no focal  motor/sensory deficits SKIN:  no rashes or significant lesions    ASSESSMENT & PLAN:   Polycythemia vera (HCC) # Polycythemia vera/essential thrombocytosis- JAK-2 POSITIVE. On hydrea  1000mg  qd.   # Hemogloin 13 today, hematocrit 38.  Patient continues aspirin 325 mg qd.  No phlebotomy.  Patient requirement for phlebotomy-has gone down since patient has been on Hydrea thousand milligrams a day.  # Miccrosopic Hematuria- ? PCP; awaiting urology evaluation.   # SMOKING- not interested in quitting;  previous attempts for LCS- not interetsted.   # H&H in 3 months/posisble phel; in 6 months     Cammie Sickle, MD 08/06/2017 4:34 PM

## 2017-08-05 NOTE — Assessment & Plan Note (Addendum)
#   Polycythemia vera/essential thrombocytosis- JAK-2 POSITIVE. On hydrea  1000mg  qd.   # Hemogloin 13 today, hematocrit 38.  Patient continues aspirin 325 mg qd.  No phlebotomy.  Patient requirement for phlebotomy-has gone down since patient has been on Hydrea thousand milligrams a day.  # Miccrosopic Hematuria- ? PCP; awaiting urology evaluation.   # SMOKING- not interested in quitting;  previous attempts for LCS- not interetsted.   # H&H in 3 months/posisble phel; in 6 months

## 2017-10-02 DIAGNOSIS — Z86010 Personal history of colonic polyps: Secondary | ICD-10-CM | POA: Insufficient documentation

## 2017-10-03 DIAGNOSIS — D473 Essential (hemorrhagic) thrombocythemia: Secondary | ICD-10-CM | POA: Insufficient documentation

## 2017-10-19 ENCOUNTER — Other Ambulatory Visit: Payer: Self-pay | Admitting: Nurse Practitioner

## 2017-10-19 DIAGNOSIS — D473 Essential (hemorrhagic) thrombocythemia: Secondary | ICD-10-CM

## 2017-10-19 DIAGNOSIS — D45 Polycythemia vera: Secondary | ICD-10-CM

## 2017-10-21 ENCOUNTER — Other Ambulatory Visit: Payer: Self-pay | Admitting: *Deleted

## 2017-10-21 DIAGNOSIS — D473 Essential (hemorrhagic) thrombocythemia: Secondary | ICD-10-CM

## 2017-10-21 DIAGNOSIS — D45 Polycythemia vera: Secondary | ICD-10-CM

## 2017-10-21 MED ORDER — HYDROXYUREA 500 MG PO CAPS
1000.0000 mg | ORAL_CAPSULE | Freq: Every day | ORAL | 3 refills | Status: DC
Start: 2017-10-21 — End: 2018-09-15

## 2017-11-04 ENCOUNTER — Inpatient Hospital Stay: Payer: Medicare Other | Attending: Internal Medicine

## 2017-11-04 ENCOUNTER — Inpatient Hospital Stay: Payer: Medicare Other

## 2017-11-04 DIAGNOSIS — D45 Polycythemia vera: Secondary | ICD-10-CM | POA: Diagnosis not present

## 2017-11-04 LAB — HEMATOCRIT: HCT: 41.9 % (ref 40.0–52.0)

## 2017-11-04 LAB — HEMOGLOBIN: HEMOGLOBIN: 14.6 g/dL (ref 13.0–18.0)

## 2017-11-14 DIAGNOSIS — K640 First degree hemorrhoids: Secondary | ICD-10-CM | POA: Diagnosis not present

## 2017-11-14 DIAGNOSIS — D126 Benign neoplasm of colon, unspecified: Secondary | ICD-10-CM | POA: Diagnosis not present

## 2017-11-14 DIAGNOSIS — K648 Other hemorrhoids: Secondary | ICD-10-CM | POA: Diagnosis not present

## 2017-11-14 DIAGNOSIS — Z86010 Personal history of colonic polyps: Secondary | ICD-10-CM | POA: Diagnosis not present

## 2017-11-14 DIAGNOSIS — D122 Benign neoplasm of ascending colon: Secondary | ICD-10-CM | POA: Diagnosis not present

## 2018-01-09 DIAGNOSIS — H2513 Age-related nuclear cataract, bilateral: Secondary | ICD-10-CM | POA: Diagnosis not present

## 2018-02-05 ENCOUNTER — Inpatient Hospital Stay: Payer: Medicare Other

## 2018-02-05 ENCOUNTER — Inpatient Hospital Stay: Payer: Medicare Other | Attending: Oncology

## 2018-02-05 ENCOUNTER — Inpatient Hospital Stay (HOSPITAL_BASED_OUTPATIENT_CLINIC_OR_DEPARTMENT_OTHER): Payer: Medicare Other | Admitting: Oncology

## 2018-02-05 ENCOUNTER — Other Ambulatory Visit: Payer: Self-pay

## 2018-02-05 ENCOUNTER — Encounter: Payer: Self-pay | Admitting: Oncology

## 2018-02-05 VITALS — BP 133/79 | HR 83 | Temp 97.6°F | Resp 18 | Ht 68.0 in | Wt 178.0 lb

## 2018-02-05 DIAGNOSIS — Z7982 Long term (current) use of aspirin: Secondary | ICD-10-CM | POA: Insufficient documentation

## 2018-02-05 DIAGNOSIS — D45 Polycythemia vera: Secondary | ICD-10-CM

## 2018-02-05 DIAGNOSIS — D473 Essential (hemorrhagic) thrombocythemia: Secondary | ICD-10-CM | POA: Insufficient documentation

## 2018-02-05 DIAGNOSIS — R319 Hematuria, unspecified: Secondary | ICD-10-CM | POA: Insufficient documentation

## 2018-02-05 DIAGNOSIS — F17210 Nicotine dependence, cigarettes, uncomplicated: Secondary | ICD-10-CM | POA: Insufficient documentation

## 2018-02-05 LAB — COMPREHENSIVE METABOLIC PANEL
ALBUMIN: 3.7 g/dL (ref 3.5–5.0)
ALK PHOS: 97 U/L (ref 38–126)
ALT: 20 U/L (ref 17–63)
ANION GAP: 7 (ref 5–15)
AST: 20 U/L (ref 15–41)
BILIRUBIN TOTAL: 1 mg/dL (ref 0.3–1.2)
BUN: 27 mg/dL — ABNORMAL HIGH (ref 6–20)
CO2: 24 mmol/L (ref 22–32)
Calcium: 9 mg/dL (ref 8.9–10.3)
Chloride: 109 mmol/L (ref 101–111)
Creatinine, Ser: 1.57 mg/dL — ABNORMAL HIGH (ref 0.61–1.24)
GFR calc non Af Amer: 44 mL/min — ABNORMAL LOW (ref >60)
GFR, EST AFRICAN AMERICAN: 51 mL/min — AB (ref >60)
Glucose, Bld: 110 mg/dL — ABNORMAL HIGH (ref 65–99)
POTASSIUM: 3.7 mmol/L (ref 3.5–5.1)
Sodium: 140 mmol/L (ref 135–145)
TOTAL PROTEIN: 6.5 g/dL (ref 6.5–8.1)

## 2018-02-05 LAB — CBC WITH DIFFERENTIAL/PLATELET
Basophils Absolute: 0.1 10*3/uL (ref 0–0.1)
Basophils Relative: 1 %
EOS PCT: 1 %
Eosinophils Absolute: 0.1 10*3/uL (ref 0–0.7)
HCT: 44.4 % (ref 40.0–52.0)
Hemoglobin: 15.6 g/dL (ref 13.0–18.0)
LYMPHS PCT: 26 %
Lymphs Abs: 1.9 10*3/uL (ref 1.0–3.6)
MCH: 37.5 pg — AB (ref 26.0–34.0)
MCHC: 35.2 g/dL (ref 32.0–36.0)
MCV: 106.6 fL — AB (ref 80.0–100.0)
MONO ABS: 0.6 10*3/uL (ref 0.2–1.0)
MONOS PCT: 9 %
Neutro Abs: 4.5 10*3/uL (ref 1.4–6.5)
Neutrophils Relative %: 63 %
PLATELETS: 306 10*3/uL (ref 150–440)
RBC: 4.17 MIL/uL — ABNORMAL LOW (ref 4.40–5.90)
RDW: 14.2 % (ref 11.5–14.5)
WBC: 7.2 10*3/uL (ref 3.8–10.6)

## 2018-02-05 NOTE — Assessment & Plan Note (Addendum)
#   Polycythemia vera/essential thrombocytosis- JAK-2 POSITIVE. On hydrea  1000mg  qd.   Today's hemoglobin is 15.6, hematocrit 44.  He continues aspirin 325 mg daily.  Patient would like to defer phlebotomy today.  He states he has not been taking his Hydrea as prescribed d/t running out recently. He received a shipment today and has started back 1000 mg daily.  He would rather wait until next lab check in 1 month and proceed with phlebotomy if needed then. Patient is aware of  complications that can occur with elevated blood counts.  Hematuria: Still has not had urology evaluation.  Denies any further hematuria at this time.  Smoking: Continues to smoke.  Does not have any interest in quitting at this time.  RTC monthly with labs and possible phlebotomy in 6 months with labs/MD assessment and possible phlebotomy.

## 2018-02-05 NOTE — Progress Notes (Signed)
Patient was out of town last week and went w/o the hydrea x 5 days as he did not have enough prescription available.

## 2018-02-05 NOTE — Progress Notes (Signed)
[facility] NOTE  Patient Care Team: Maryland Pink, MD as PCP - General (Family Medicine)  CHIEF COMPLAINTS/PURPOSE OF CONSULTATION:   # April 2017- ESSENTIAL THROMBOCYTOSIS/-POLYCYTHEMIA VERA- JAK-2 POSITIVE [HCT 52.9/platelets- 073]; Recom phlebotomy/ Hydrea 500 BID / Aspirin 325 mg a day  # Smoker- Recm quitting  HISTORY OF PRESENTING ILLNESS:  Patient presents today with above history currently on Hydrea 500 mg twice a day.  Has been needing phlebotomy every 2 months.  States he feels good no matter what.  He has not noticed any difference after phlebotomies.  He denies any headaches or thromboembolic events.  Unfortunately continues to smoke.  Denies any chest pain or shortness of breath.  Has chronic mild tingling and numbness in feet but is not any worse.  He is on aspirin.  Recently did not receive his shipment for hydroxyurea and was only taking 1 tablet once a day for the past week.  He received his new shipment and today and will begin his prescribed dose.  ROS:  Review of Systems  Constitutional: Negative.  Negative for chills, fever, malaise/fatigue and weight loss.  HENT: Negative for congestion and ear pain.   Eyes: Negative.  Negative for blurred vision and double vision.  Respiratory: Negative.  Negative for cough, sputum production and shortness of breath.   Cardiovascular: Negative.  Negative for chest pain, palpitations and leg swelling.  Gastrointestinal: Negative.  Negative for abdominal pain, constipation, diarrhea, nausea and vomiting.  Genitourinary: Negative for dysuria, frequency and urgency.  Musculoskeletal: Negative for back pain and falls.  Skin: Negative.  Negative for rash.  Neurological: Negative.  Negative for weakness and headaches.  Endo/Heme/Allergies: Negative.  Does not bruise/bleed easily.  Psychiatric/Behavioral: Negative.  Negative for depression. The patient is not nervous/anxious and does not have insomnia.      MEDICAL HISTORY:  Past Medical History:  Diagnosis Date  . Polycythemia vera ([place])   . Thrombocytosis ([place])   . Tobacco abuse     SURGICAL HISTORY: Past Surgical History:  Procedure Laterality Date  . AV FISTULA REPAIR    . KNEE ARTHROSCOPY W/ MENISCAL REPAIR    . 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 Social History   Socioeconomic History  . Marital status: Married    Spouse name: Not on file  . Number of children: Not on file  . Years of education: Not on file  . Highest education level: Not on file  Occupational History  . Not on file  Social Needs  . Financial resource strain: Not on file  . Food insecurity:    Worry: Not on file    Inability: Not on file  . Transportation needs:    Medical: Not on file    Non-medical: Not on file  Tobacco Use  . Smoking status: Current Every Day Smoker    Packs/day: 1.50    Years: 30.00    Pack years: 45.00    Types: Cigarettes  . Smokeless tobacco: Never Used  . Tobacco comment: uses e-cig  Substance and Sexual Activity  . Alcohol use: No    Alcohol/week: 0.0 oz    Comment: rarely  . Drug use: No  . Sexual activity: Not on file  Lifestyle  . Physical activity:    Days per week: Not on file    Minutes per session: Not on file  . Stress: Not on file  Relationships  . Social connections:  Talks on phone: Not on file    Gets together: Not on file    Attends religious service: Not on file    Active member of club or organization: Not on file    Attends meetings of clubs or organizations: Not on file    Relationship status: Not on file  . Intimate partner violence:    Fear of current or ex partner: Not on file    Emotionally abused: Not on file    Physically abused: Not on file    Forced sexual activity: Not on file  Other Topics Concern  . Not on file  Social History Narrative  . Not on file    FAMILY HISTORY: dad- stomach cancer- 55s.   Family History  Problem Relation Age of Onset  . Stomach cancer Father     ALLERGIES:  has No Known Allergies.  MEDICATIONS:  Current Outpatient Medications  Medication Sig Dispense Refill  . aspirin EC 81 MG tablet Take 81 mg by mouth daily.    . diclofenac (VOLTAREN) 75 MG EC tablet Take 1 tablet by mouth daily.  1  . hydroxyurea (HYDREA) 500 MG capsule Take 2 capsules (1,000 mg total) by mouth daily. May take with food to minimize GI side effects. 60 capsule 3  . ibuprofen (ADVIL,MOTRIN) 200 MG tablet Take 200 mg by mouth every 6 (six) hours as needed.    . venlafaxine XR (EFFEXOR-XR) 75 MG 24 hr capsule Take by mouth.     No current facility-administered medications for this visit.       Marland Kitchen  PHYSICAL EXAMINATION: ECOG PERFORMANCE STATUS: 0 - Asymptomatic  Vitals:   02/05/18 1411  BP: 133/79  Pulse: 83  Resp: 18  Temp: 97.6 F (36.4 C)   Filed Weights   02/05/18 1411  Weight: 178 lb (80.7 kg)    Physical Exam  Constitutional: He is oriented to person, place, and time and well-developed, well-nourished, and in no distress. Vital signs are normal.  HENT:  Head: Normocephalic and atraumatic.  Eyes: Pupils are equal, round, and reactive to light.  Neck: Normal range of motion.  Cardiovascular: Normal rate, regular rhythm and normal heart sounds.  No murmur heard. Pulmonary/Chest: Effort normal and breath sounds normal. He has no wheezes.  Abdominal: Soft. Normal appearance and bowel sounds are normal. He exhibits no distension. There is no tenderness.  Musculoskeletal: Normal range of motion. He exhibits no edema.  Neurological: He is alert and oriented to person, place, and time. Gait normal.  Skin: Skin is warm and dry. No rash noted.  Psychiatric: Mood, memory, affect and judgment normal.     ASSESSMENT & PLAN:   Polycythemia vera ([place]) # Polycythemia vera/essential thrombocytosis- JAK-2 POSITIVE. On hydrea  1000mg  qd.   Today's hemoglobin is 15.6,  hematocrit 44.  He continues aspirin 325 mg daily.  Patient would like to defer phlebotomy today.  He states he has not been taking his Hydrea as prescribed d/t running out recently. He received a shipment today and has started back 1000 mg daily.  He would rather wait until next lab check in 1 month and proceed with phlebotomy if needed then. Patient is aware of  complications that can occur with elevated blood counts.  Hematuria: Still has not had urology evaluation.  Denies any further hematuria at this time.  Smoking: Continues to smoke.  Does not have any interest in quitting at this time.  RTC monthly with labs and possible phlebotomy in 6 months with labs/MD  assessment and possible phlebotomy.    Greater than 50% was spent in counseling and coordination of care with this patient including but not limited to discussion of the relevant topics above (See A&P) including, but not limited to diagnosis and management of acute and chronic medical conditions.    Jacquelin Hawking, NP 02/05/2018 3:38 PM

## 2018-02-09 ENCOUNTER — Telehealth: Payer: Self-pay | Admitting: Internal Medicine

## 2018-02-09 DIAGNOSIS — D45 Polycythemia vera: Secondary | ICD-10-CM

## 2018-02-09 NOTE — Telephone Encounter (Signed)
Jonathan Evans- Please add bmp to next lab draw. Given recent elevated creatinine of 1.5 [new from base line]. Thx GB

## 2018-02-10 NOTE — Addendum Note (Signed)
Addended by: Sabino Gasser on: 02/10/2018 08:17 AM   Modules accepted: Orders

## 2018-02-10 NOTE — Telephone Encounter (Signed)
metb added per md order

## 2018-03-17 ENCOUNTER — Other Ambulatory Visit: Payer: Medicare Other

## 2018-04-15 ENCOUNTER — Inpatient Hospital Stay: Payer: Medicare Other

## 2018-04-15 ENCOUNTER — Inpatient Hospital Stay: Payer: Medicare Other | Attending: Internal Medicine

## 2018-04-15 DIAGNOSIS — D45 Polycythemia vera: Secondary | ICD-10-CM | POA: Diagnosis not present

## 2018-04-15 LAB — BASIC METABOLIC PANEL
Anion gap: 5 (ref 5–15)
BUN: 29 mg/dL — ABNORMAL HIGH (ref 8–23)
CALCIUM: 9.1 mg/dL (ref 8.9–10.3)
CO2: 25 mmol/L (ref 22–32)
CREATININE: 1.36 mg/dL — AB (ref 0.61–1.24)
Chloride: 110 mmol/L (ref 98–111)
GFR calc Af Amer: >60 mL/min (ref >60)
GFR calc non Af Amer: 53 mL/min — ABNORMAL LOW (ref >60)
Glucose, Bld: 111 mg/dL — ABNORMAL HIGH (ref 70–99)
Potassium: 4 mmol/L (ref 3.5–5.1)
Sodium: 140 mmol/L (ref 135–145)

## 2018-04-15 LAB — CBC WITH DIFFERENTIAL/PLATELET
Basophils Absolute: 0 10*3/uL (ref 0–0.1)
Basophils Relative: 0 %
Eosinophils Absolute: 0.1 10*3/uL (ref 0–0.7)
Eosinophils Relative: 1 %
HEMATOCRIT: 41.6 % (ref 40.0–52.0)
Hemoglobin: 14.4 g/dL (ref 13.0–18.0)
Lymphocytes Relative: 30 %
Lymphs Abs: 1.7 10*3/uL (ref 1.0–3.6)
MCH: 38.7 pg — AB (ref 26.0–34.0)
MCHC: 34.7 g/dL (ref 32.0–36.0)
MCV: 111.4 fL — ABNORMAL HIGH (ref 80.0–100.0)
MONO ABS: 0.5 10*3/uL (ref 0.2–1.0)
MONOS PCT: 9 %
NEUTROS ABS: 3.3 10*3/uL (ref 1.4–6.5)
Neutrophils Relative %: 60 %
Platelets: 374 10*3/uL (ref 150–440)
RBC: 3.73 MIL/uL — ABNORMAL LOW (ref 4.40–5.90)
RDW: 18.3 % — AB (ref 11.5–14.5)
WBC: 5.6 10*3/uL (ref 3.8–10.6)

## 2018-05-12 ENCOUNTER — Inpatient Hospital Stay: Payer: Medicare Other | Attending: Internal Medicine

## 2018-05-12 ENCOUNTER — Other Ambulatory Visit: Payer: Self-pay | Admitting: *Deleted

## 2018-05-12 ENCOUNTER — Inpatient Hospital Stay: Payer: Medicare Other

## 2018-05-12 DIAGNOSIS — D473 Essential (hemorrhagic) thrombocythemia: Secondary | ICD-10-CM | POA: Diagnosis not present

## 2018-05-12 DIAGNOSIS — D45 Polycythemia vera: Secondary | ICD-10-CM

## 2018-05-12 LAB — HEMATOCRIT: HCT: 37.3 % — ABNORMAL LOW (ref 40.0–52.0)

## 2018-05-12 LAB — HEMOGLOBIN: HEMOGLOBIN: 13.2 g/dL (ref 13.0–18.0)

## 2018-06-16 ENCOUNTER — Inpatient Hospital Stay: Payer: Medicare Other | Attending: Internal Medicine

## 2018-06-16 ENCOUNTER — Inpatient Hospital Stay: Payer: Medicare Other

## 2018-06-16 DIAGNOSIS — D45 Polycythemia vera: Secondary | ICD-10-CM | POA: Diagnosis not present

## 2018-06-16 DIAGNOSIS — D473 Essential (hemorrhagic) thrombocythemia: Secondary | ICD-10-CM | POA: Insufficient documentation

## 2018-06-16 LAB — HEMATOCRIT: HCT: 38.9 % — ABNORMAL LOW (ref 39.0–52.0)

## 2018-06-16 LAB — HEMOGLOBIN: Hemoglobin: 13.8 g/dL (ref 13.0–17.0)

## 2018-07-04 DIAGNOSIS — Z23 Encounter for immunization: Secondary | ICD-10-CM | POA: Diagnosis not present

## 2018-07-14 ENCOUNTER — Inpatient Hospital Stay: Payer: Medicare Other | Attending: Internal Medicine

## 2018-07-14 ENCOUNTER — Inpatient Hospital Stay: Payer: Medicare Other

## 2018-07-14 DIAGNOSIS — D473 Essential (hemorrhagic) thrombocythemia: Secondary | ICD-10-CM | POA: Insufficient documentation

## 2018-07-14 DIAGNOSIS — D45 Polycythemia vera: Secondary | ICD-10-CM

## 2018-07-14 LAB — HEMOGLOBIN: HEMOGLOBIN: 14.5 g/dL (ref 13.0–17.0)

## 2018-07-14 LAB — HEMATOCRIT: HEMATOCRIT: 42.7 % (ref 39.0–52.0)

## 2018-08-11 ENCOUNTER — Inpatient Hospital Stay (HOSPITAL_BASED_OUTPATIENT_CLINIC_OR_DEPARTMENT_OTHER): Payer: Medicare Other | Admitting: Internal Medicine

## 2018-08-11 ENCOUNTER — Inpatient Hospital Stay: Payer: Medicare Other | Attending: Internal Medicine

## 2018-08-11 ENCOUNTER — Inpatient Hospital Stay: Payer: Medicare Other

## 2018-08-11 ENCOUNTER — Telehealth: Payer: Self-pay | Admitting: *Deleted

## 2018-08-11 DIAGNOSIS — D45 Polycythemia vera: Secondary | ICD-10-CM

## 2018-08-11 LAB — CBC WITH DIFFERENTIAL/PLATELET
Abs Immature Granulocytes: 0.02 10*3/uL (ref 0.00–0.07)
BASOS PCT: 0 %
Basophils Absolute: 0 10*3/uL (ref 0.0–0.1)
Eosinophils Absolute: 0.1 10*3/uL (ref 0.0–0.5)
Eosinophils Relative: 1 %
HCT: 42.2 % (ref 39.0–52.0)
Hemoglobin: 14.4 g/dL (ref 13.0–17.0)
Immature Granulocytes: 0 %
LYMPHS ABS: 1.8 10*3/uL (ref 0.7–4.0)
Lymphocytes Relative: 24 %
MCH: 39.2 pg — ABNORMAL HIGH (ref 26.0–34.0)
MCHC: 34.1 g/dL (ref 30.0–36.0)
MCV: 115 fL — AB (ref 80.0–100.0)
MONOS PCT: 7 %
Monocytes Absolute: 0.5 10*3/uL (ref 0.1–1.0)
NEUTROS PCT: 68 %
Neutro Abs: 5.1 10*3/uL (ref 1.7–7.7)
PLATELETS: 203 10*3/uL (ref 150–400)
RBC: 3.67 MIL/uL — ABNORMAL LOW (ref 4.22–5.81)
RDW: 12.5 % (ref 11.5–15.5)
WBC: 7.6 10*3/uL (ref 4.0–10.5)
nRBC: 0 % (ref 0.0–0.2)

## 2018-08-11 LAB — COMPREHENSIVE METABOLIC PANEL
ALBUMIN: 3.8 g/dL (ref 3.5–5.0)
ALT: 20 U/L (ref 0–44)
AST: 17 U/L (ref 15–41)
Alkaline Phosphatase: 89 U/L (ref 38–126)
Anion gap: 8 (ref 5–15)
BUN: 23 mg/dL (ref 8–23)
CHLORIDE: 108 mmol/L (ref 98–111)
CO2: 26 mmol/L (ref 22–32)
Calcium: 8.7 mg/dL — ABNORMAL LOW (ref 8.9–10.3)
Creatinine, Ser: 1.16 mg/dL (ref 0.61–1.24)
GFR calc Af Amer: >60 mL/min (ref >60)
GLUCOSE: 121 mg/dL — AB (ref 70–99)
Potassium: 3.9 mmol/L (ref 3.5–5.1)
Sodium: 142 mmol/L (ref 135–145)
Total Bilirubin: 0.5 mg/dL (ref 0.3–1.2)
Total Protein: 6.5 g/dL (ref 6.5–8.1)

## 2018-08-11 NOTE — Telephone Encounter (Signed)
Left vm for patient to return my phone call. 

## 2018-08-11 NOTE — Telephone Encounter (Signed)
Left vm for pt to return my phone call to discuss plan of care and today's lab results.

## 2018-08-11 NOTE — Telephone Encounter (Signed)
I received a msg from [place] in cancer center lab. Patient had labs drawn and then told the CMA that he would not stay for the MD or possible phlebotomy.  I spoke with Dr. Rogue Bussing. MD recommends patient to follow-up for lab possible phlebotomy (one blood clinic) in 3 months (H&H) and lab/md/possible phlebotomy in 6 months (one blood clinic) (CBC, metc)

## 2018-08-14 NOTE — Telephone Encounter (Signed)
Spoke with patient regarding test results. Patient gave verbal understanding of the plan of care.

## 2018-09-15 ENCOUNTER — Other Ambulatory Visit: Payer: Self-pay | Admitting: Internal Medicine

## 2018-09-15 DIAGNOSIS — D45 Polycythemia vera: Secondary | ICD-10-CM

## 2018-09-15 DIAGNOSIS — D473 Essential (hemorrhagic) thrombocythemia: Secondary | ICD-10-CM

## 2018-09-15 NOTE — Telephone Encounter (Signed)
)   Ref Range & Units 18mo ago (08/11/18) 39mo ago (07/14/18) 64mo ago (07/14/18) 79mo ago (06/16/18) 86mo ago (06/16/18) 41mo ago (05/12/18) 73mo ago (05/12/18)  WBC 4.0 - 10.5 K/uL 7.6         RBC 4.22 - 5.81 MIL/uL 3.67Low          Hemoglobin 13.0 - 17.0 g/dL 14.4   14.5 CM  13.8 CM  13.2 R, CM  HCT 39.0 - 52.0 % 42.2  42.7 CM  38.9Low  CM  37.3Low  R, CM   MCV 80.0 - 100.0 fL 115.0High          MCH 26.0 - 34.0 pg 39.2High          MCHC 30.0 - 36.0 g/dL 34.1         RDW 11.5 - 15.5 % 12.5         Platelets 150 - 400 K/uL 203         nRBC 0.0 - 0.2 % 0.0         Neutrophils Relative % % 68         Neutro Abs 1.7 - 7.7 K/uL 5.1         Lymphocytes Relative % 24         Lymphs Abs 0.7 - 4.0 K/uL 1.8         Monocytes Relative % 7         Monocytes Absolute 0.1 - 1.0 K/uL 0.5         Eosinophils Relative % 1         Eosinophils Absolute 0.0 - 0.5 K/uL 0.1         Basophils Relative % 0         Basophils Absolute 0.0 - 0.1 K/uL 0.0         Immature Granulocytes % 0         Abs Immature Granulocytes 0.00 - 0.07 K/uL 0.02         Comment: Performed at [facility], [address] [place], [place] [address]  Resulting Agency  [facility] [place] [facility] [place] [facility] [place] [facility] [place] [facility] [place] [facility] [place] [facility]      Specimen Collected: 08/11/18 13:00  Last Resulted: 08/11/18 13:13     Lab Flowsheet    Order Details    View Encounter    Lab and Collection Details    Routing    Result History      CM=Additional commentsR=Reference range differs from displayed range      Other Results from 08/11/2018   Contains abnormal data Comprehensive metabolic panel  Order: [id number]   Status:  Final result  Visible to patient:  No (Not Released)  Next appt:  11/10/2018 at 09:30 AM in Oncology ([facility])  Dx:  Polycythemia vera ([place])   Ref Range & Units 37mo ago (08/11/18) 18mo ago (04/15/18) 75mo ago (02/05/18) 13yr ago (08/05/17) 45yr ago (11/02/16) 52yr ago (05/04/16)  41yr ago (02/02/16)  Sodium 135 - 145 mmol/L 142  140  140  140  138  137  141   Potassium 3.5 - 5.1 mmol/L 3.9  4.0  3.7  4.1  3.9  4.0  3.9   Chloride 98 - 111 mmol/L 108  110  109 R 105 R 107 R 108 R 108 R  CO2 22 - 32 mmol/L 26  25  24  28  25   24  23   Glucose, Bld 70 - 99 mg/dL 121High   111High   110High  R 121High  R 150High  R 105High  R 145High  R  BUN 8 - 23 mg/dL 23  29High   27High  R 25High  R 26High  R 23High  R 27High  R  Creatinine, Ser 0.61 - 1.24 mg/dL 1.16  1.36High   1.57High   1.20  1.19  1.09  1.15   Calcium 8.9 - 10.3 mg/dL 8.7Low   9.1  9.0  8.9  8.7Low   8.7Low   8.9   Total Protein 6.5 - 8.1 g/dL 6.5   6.5  6.2Low   6.6  6.4Low   6.3Low    Albumin 3.5 - 5.0 g/dL 3.8   3.7  3.6  3.7  3.9  3.8   AST 15 - 41 U/L 17   20  16  26  17  20    ALT 0 - 44 U/L 20   20 R 14Low  R 17 R 16Low  R 20 R  Alkaline Phosphatase 38 - 126 U/L 89   97  94  92  94  99   Total Bilirubin 0.3 - 1.2 mg/dL 0.5   1.0  0.7  0.7  0.7  0.7   GFR calc non Af Amer >60 mL/min >60  53Low   44Low   >60  >60  >60  >60   GFR calc Af Amer >60 mL/min >60  >60 CM 51Low  CM >60 CM >60 CM >60 CM >60 CM  Anion gap 5 - 15 8  5  CM 7 CM 7  6  5  10    Comment: Performed at [facility], [address] [place], [place] [address]  Resulting Agency  [facility] CLIN LAB [place] CL[facility] LAB [place] CL[facility] [place] [facility] [place] [facility] [place] [facility] [place] [facility]      Specimen Collected: 08/11/18 13:00  Last Resulted: 08/11/18 13:24

## 2018-10-25 NOTE — Progress Notes (Signed)
Pt had labs/ and left with OUT been seen by me. Follow up instructions given to pt. Dr.B

## 2018-11-10 ENCOUNTER — Inpatient Hospital Stay: Payer: Medicare Other

## 2019-01-11 ENCOUNTER — Other Ambulatory Visit: Payer: Self-pay | Admitting: Nurse Practitioner

## 2019-01-11 DIAGNOSIS — D45 Polycythemia vera: Secondary | ICD-10-CM

## 2019-01-11 DIAGNOSIS — D473 Essential (hemorrhagic) thrombocythemia: Secondary | ICD-10-CM

## 2019-01-20 ENCOUNTER — Other Ambulatory Visit: Payer: Self-pay | Admitting: *Deleted

## 2019-01-20 DIAGNOSIS — D45 Polycythemia vera: Secondary | ICD-10-CM

## 2019-01-20 DIAGNOSIS — D473 Essential (hemorrhagic) thrombocythemia: Secondary | ICD-10-CM

## 2019-01-21 MED ORDER — HYDROXYUREA 500 MG PO CAPS: ORAL_CAPSULE | ORAL | 3 refills | Status: DC

## 2019-02-06 ENCOUNTER — Other Ambulatory Visit: Payer: Self-pay

## 2019-02-09 ENCOUNTER — Inpatient Hospital Stay: Payer: Medicare Other | Attending: Internal Medicine

## 2019-02-09 ENCOUNTER — Other Ambulatory Visit: Payer: Self-pay

## 2019-02-09 ENCOUNTER — Inpatient Hospital Stay: Payer: Medicare Other

## 2019-02-09 ENCOUNTER — Inpatient Hospital Stay (HOSPITAL_BASED_OUTPATIENT_CLINIC_OR_DEPARTMENT_OTHER): Payer: Medicare Other | Admitting: Internal Medicine

## 2019-02-09 VITALS — BP 132/68 | HR 73 | Resp 18

## 2019-02-09 DIAGNOSIS — F17210 Nicotine dependence, cigarettes, uncomplicated: Secondary | ICD-10-CM | POA: Insufficient documentation

## 2019-02-09 DIAGNOSIS — D473 Essential (hemorrhagic) thrombocythemia: Secondary | ICD-10-CM | POA: Diagnosis not present

## 2019-02-09 DIAGNOSIS — D45 Polycythemia vera: Secondary | ICD-10-CM

## 2019-02-09 LAB — COMPREHENSIVE METABOLIC PANEL
ALT: 22 U/L (ref 0–44)
AST: 19 U/L (ref 15–41)
Albumin: 3.8 g/dL (ref 3.5–5.0)
Alkaline Phosphatase: 100 U/L (ref 38–126)
Anion gap: 8 (ref 5–15)
BUN: 20 mg/dL (ref 8–23)
CO2: 26 mmol/L (ref 22–32)
Calcium: 8.8 mg/dL — ABNORMAL LOW (ref 8.9–10.3)
Chloride: 107 mmol/L (ref 98–111)
Creatinine, Ser: 1.32 mg/dL — ABNORMAL HIGH (ref 0.61–1.24)
GFR calc Af Amer: >60 mL/min (ref >60)
GFR calc non Af Amer: 55 mL/min — ABNORMAL LOW (ref >60)
Glucose, Bld: 140 mg/dL — ABNORMAL HIGH (ref 70–99)
Potassium: 4 mmol/L (ref 3.5–5.1)
Sodium: 141 mmol/L (ref 135–145)
Total Bilirubin: 0.5 mg/dL (ref 0.3–1.2)
Total Protein: 6.6 g/dL (ref 6.5–8.1)

## 2019-02-09 LAB — CBC WITH DIFFERENTIAL/PLATELET
Abs Immature Granulocytes: 0.03 10*3/uL (ref 0.00–0.07)
Basophils Absolute: 0 10*3/uL (ref 0.0–0.1)
Basophils Relative: 1 %
Eosinophils Absolute: 0.1 10*3/uL (ref 0.0–0.5)
Eosinophils Relative: 1 %
HCT: 47.2 % (ref 39.0–52.0)
Hemoglobin: 16.1 g/dL (ref 13.0–17.0)
Immature Granulocytes: 0 %
Lymphocytes Relative: 22 %
Lymphs Abs: 1.9 10*3/uL (ref 0.7–4.0)
MCH: 37.1 pg — ABNORMAL HIGH (ref 26.0–34.0)
MCHC: 34.1 g/dL (ref 30.0–36.0)
MCV: 108.8 fL — ABNORMAL HIGH (ref 80.0–100.0)
Monocytes Absolute: 0.7 10*3/uL (ref 0.1–1.0)
Monocytes Relative: 9 %
Neutro Abs: 5.8 10*3/uL (ref 1.7–7.7)
Neutrophils Relative %: 67 %
Platelets: 349 10*3/uL (ref 150–400)
RBC: 4.34 MIL/uL (ref 4.22–5.81)
RDW: 12.7 % (ref 11.5–15.5)
WBC: 8.6 10*3/uL (ref 4.0–10.5)
nRBC: 0 % (ref 0.0–0.2)

## 2019-02-09 NOTE — Progress Notes (Signed)
[place] NOTE  Patient Care Team: Maryland Pink, MD as PCP - General (Family Medicine)  CHIEF COMPLAINTS/PURPOSE OF CONSULTATION:   # April 2017- ESSENTIAL THROMBOCYTOSIS/-POLYCYTHEMIA VERA- JAK-2 POSITIVE [HCT 52.9/platelets- 759]; Recom phlebotomy/ Hydrea 500 BID / Aspirin 325 mg a day  # Smoker- Recm quitting  HISTORY OF PRESENTING ILLNESS:  Jonathan Evans 68 y.o.  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  Patient has not had any phlebotomies in the last few months.  Patient is supposed to take 500 mg twice a day; however he is taking 1 pill a day.  Denies having any blood in stools or black or stools.  Denies any blood in urine.  Continues to chronic joint pains/back pain not any worse.  Review of Systems  Constitutional: Negative for chills, diaphoresis, fever, malaise/fatigue and weight loss.  HENT: Negative for nosebleeds and sore throat.   Eyes: Negative for double vision.  Respiratory: Negative for cough, hemoptysis, sputum production, shortness of breath and wheezing.   Cardiovascular: Negative for chest pain, palpitations, orthopnea and leg swelling.  Gastrointestinal: Negative for abdominal pain, blood in stool, constipation, diarrhea, heartburn, melena, nausea and vomiting.  Genitourinary: Negative for dysuria, frequency and urgency.  Musculoskeletal: Positive for back pain and joint pain.  Skin: Negative.  Negative for itching and rash.  Neurological: Negative for dizziness, tingling, focal weakness, weakness and headaches.  Endo/Heme/Allergies: Does not bruise/bleed easily.  Psychiatric/Behavioral: Negative for depression. The patient is not nervous/anxious and does not have insomnia.      MEDICAL HISTORY:  Past Medical History:  Diagnosis Date  . Polycythemia vera ([place])   . Thrombocytosis ([place])   . Tobacco abuse     SURGICAL HISTORY: Past Surgical History:  Procedure  Laterality Date  . AV FISTULA REPAIR    . KNEE ARTHROSCOPY W/ MENISCAL REPAIR    . 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  Denies any alcohol. Social History   Socioeconomic History  . Marital status: Married    Spouse name: Not on file  . Number of children: Not on file  . Years of education: Not on file  . Highest education level: Not on file  Occupational History  . Not on file  Social Needs  . Financial resource strain: Not on file  . Food insecurity    Worry: Not on file    Inability: Not on file  . Transportation needs    Medical: Not on file    Non-medical: Not on file  Tobacco Use  . Smoking status: Current Every Day Smoker    Packs/day: 1.50    Years: 30.00    Pack years: 45.00    Types: Cigarettes  . Smokeless tobacco: Never Used  . Tobacco comment: uses e-cig  Substance and Sexual Activity  . Alcohol use: No    Alcohol/week: 0.0 standard drinks    Comment: rarely  . Drug use: No  . Sexual activity: Not on file  Lifestyle  . Physical activity    Days per week: Not on file    Minutes per session: Not on file  . Stress: Not on file  Relationships  . Social Herbalist on phone: Not on file    Gets together: Not on file    Attends religious service: Not on file    Active member of club or organization: Not on file  Attends meetings of clubs or organizations: Not on file    Relationship status: Not on file  . Intimate partner violence    Fear of current or ex partner: Not on file    Emotionally abused: Not on file    Physically abused: Not on file    Forced sexual activity: Not on file  Other Topics Concern  . Not on file  Social History Narrative  . Not on file    FAMILY HISTORY: dad- stomach cancer- 50s.  Family History  Problem Relation Age of Onset  . Stomach cancer Father     ALLERGIES:  has No Known Allergies.  MEDICATIONS:  Current Outpatient Medications  Medication Sig  Dispense Refill  . aspirin EC 81 MG tablet Take 81 mg by mouth daily.    . diclofenac (VOLTAREN) 75 MG EC tablet Take 1 tablet by mouth daily.  1  . hydroxyurea (HYDREA) 500 MG capsule TAKE 2 CAPSULE BY MOUTH ONCE DAILY TAKE WITH FOOD 60 capsule 3  . ibuprofen (ADVIL,MOTRIN) 200 MG tablet Take 200 mg by mouth every 6 (six) hours as needed.    . venlafaxine XR (EFFEXOR-XR) 75 MG 24 hr capsule Take by mouth.     No current facility-administered medications for this visit.       Marland Kitchen  PHYSICAL EXAMINATION: ECOG PERFORMANCE STATUS: 0 - Asymptomatic  Vitals:   02/09/19 1324  BP: (!) 155/83  Pulse: 85  Resp: 18  Temp: 98 F (36.7 C)   Filed Weights   02/09/19 1324  Weight: 183 lb 14.4 oz (83.4 kg)   Physical Exam  Constitutional: He is oriented to person, place, and time and well-developed, well-nourished, and in no distress.  HENT:  Head: Normocephalic and atraumatic.  Mouth/Throat: Oropharynx is clear and moist. No oropharyngeal exudate.  Eyes: Pupils are equal, round, and reactive to light.  Neck: Normal range of motion. Neck supple.  Cardiovascular: Normal rate and regular rhythm.  Pulmonary/Chest: No respiratory distress. He has no wheezes.  Decreased air entry bilaterally.  No wheeze or crackles  Abdominal: Soft. Bowel sounds are normal. He exhibits no distension and no mass. There is no abdominal tenderness. There is no rebound and no guarding.  Musculoskeletal: Normal range of motion.        General: No tenderness or edema.  Neurological: He is alert and oriented to person, place, and time.  Skin: Skin is warm.  Psychiatric: Affect normal.     ASSESSMENT & PLAN:   Polycythemia vera ([place]) # Polycythemia vera/essential thrombocytosis- JAK-2 POSITIVE. On hydrea  500 mg qd.   # Hemogloin 16 today, hematocrit 47.    Given the elevated hematocrit recommend Hydrea 500 mg twice a day.  Again discussed the risk of stroke/with elevated hematocrit/smoking/poorly controlled  blood pressures.   # SMOKING/LCS-counseled the patient to quit smoking. not interested.  Repeat chest x-ray at next visit  #Elevated blood pressure-systolic 789 recommend close blood pressure monitoring.  Continues to be elevated to inform us/PCP.  # # DISPOSITION: # Phlebotomy # H&H in 3 months/possible phlebotomy # 6 months; MD; cbc/cmp/ldh- possible phlebotomyl Dr.B      Cammie Sickle, MD 02/09/2019 2:55 PM

## 2019-02-09 NOTE — Assessment & Plan Note (Addendum)
#   Polycythemia vera/essential thrombocytosis- JAK-2 POSITIVE. On hydrea  500 mg qd.   # Hemogloin 16 today, hematocrit 47.    Given the elevated hematocrit recommend Hydrea 500 mg twice a day.  Again discussed the risk of stroke/with elevated hematocrit/smoking/poorly controlled blood pressures.   # SMOKING/LCS-counseled the patient to quit smoking. not interested.  Repeat chest x-ray at next visit  #Elevated blood pressure-systolic 746 recommend close blood pressure monitoring.  Continues to be elevated to inform us/PCP.  # # DISPOSITION: # Phlebotomy # H&H in 3 months/possible phlebotomy # 6 months; MD; cbc/cmp/ldh- possible phlebotomyl Dr.B

## 2019-02-09 NOTE — Progress Notes (Signed)
Patient has been taking the Hydrea by alternating days of 1 tab then 2 tabs.  Also reporting he is starting to have tingling in his feet that he is noticing more frequent.

## 2019-05-11 ENCOUNTER — Inpatient Hospital Stay: Payer: Medicare Other

## 2019-05-11 ENCOUNTER — Inpatient Hospital Stay: Payer: Medicare Other | Attending: Internal Medicine

## 2019-05-26 ENCOUNTER — Other Ambulatory Visit: Payer: Self-pay

## 2019-05-27 ENCOUNTER — Inpatient Hospital Stay: Payer: Medicare Other | Attending: Internal Medicine

## 2019-05-27 ENCOUNTER — Other Ambulatory Visit: Payer: Self-pay

## 2019-05-27 ENCOUNTER — Inpatient Hospital Stay: Payer: Medicare Other

## 2019-05-27 DIAGNOSIS — D45 Polycythemia vera: Secondary | ICD-10-CM | POA: Diagnosis not present

## 2019-05-27 LAB — HEMOGLOBIN: Hemoglobin: 14.1 g/dL (ref 13.0–17.0)

## 2019-05-27 LAB — HEMATOCRIT: HCT: 41.3 % (ref 39.0–52.0)

## 2019-06-26 ENCOUNTER — Other Ambulatory Visit: Payer: Self-pay | Admitting: Internal Medicine

## 2019-06-26 DIAGNOSIS — D45 Polycythemia vera: Secondary | ICD-10-CM

## 2019-06-26 DIAGNOSIS — D473 Essential (hemorrhagic) thrombocythemia: Secondary | ICD-10-CM

## 2019-06-26 NOTE — Telephone Encounter (Signed)
Hemoglobin [facility]) Order: [id number] Status:  Final result Visible to patient:  No (not released) Next appt:  08/10/2019 at 01:00 PM in Oncology ([facility]) Dx:  Polycythemia vera ([place])  Ref Range & Units 18mo ago 28mo ago  Hemoglobin 13.0 - 17.0 g/dL 14.1  16.1   Comment: Performed at [facility], [address] [place], [place] [address]  Resulting Agency  [facility] CLIN LAB [facility] CLIN LAB      Specimen Collected: 05/27/19 14:08 Last Resulted: 05/27/19 14:14     Lab Flowsheet   Order Details   View Encounter   Lab and Collection Details   Routing   Result History         Other Results from 05/27/2019  Hematocrit [facility]) Order: [id number]  Status:  Final result Visible to patient:  No (not released) Next appt:  08/10/2019 at 01:00 PM in Oncology ([facility]) Dx:  Polycythemia vera ([place])  Ref Range & Units 81mo ago 10mo ago  HCT 39.0 - 52.0 % 41.3  47.2   Comment: Performed at [facility], [address] [place], [place] [address]  Resulting Agency  [facility] CLIN LAB [facility] CLIN LAB      Specimen Collected: 05/27/19 14:08 Last Resulted: 05/27/19 14:14

## 2019-08-10 ENCOUNTER — Inpatient Hospital Stay: Payer: Medicare Other

## 2019-08-10 ENCOUNTER — Inpatient Hospital Stay: Payer: Medicare Other | Admitting: Internal Medicine

## 2019-09-11 ENCOUNTER — Inpatient Hospital Stay: Payer: Medicare Other | Attending: Internal Medicine

## 2019-09-11 ENCOUNTER — Inpatient Hospital Stay (HOSPITAL_BASED_OUTPATIENT_CLINIC_OR_DEPARTMENT_OTHER): Payer: Medicare Other | Admitting: Internal Medicine

## 2019-09-11 DIAGNOSIS — D45 Polycythemia vera: Secondary | ICD-10-CM | POA: Insufficient documentation

## 2019-09-11 DIAGNOSIS — F17210 Nicotine dependence, cigarettes, uncomplicated: Secondary | ICD-10-CM

## 2019-09-11 NOTE — Assessment & Plan Note (Addendum)
#   Polycythemia vera/essential thrombocytosis- JAK-2 POSITIVE. On hydrea  500 mg BID. Awaiting labs today/this week.  # SMOKING/LCS- Not interested.   # I discussed regarding Covid-19 precautions.  I reviewed the vaccine effectiveness and potential side effects in detail.  Also discussed long-term effectiveness and safety profile are unclear at this time.  I discussed December, 2020 ASCO position statement-that all patients are recommended COVID-19 vaccinations [when available]-as long as they do not have allergy to components of the vaccine.  However, I think the benefits of the vaccination outweigh the potential risks. Re: ALPFX-90 vaccination.  For more information/scheduling recommend call [phone], 8:30am-4:30pm.   # DISPOSITION: # labs next week/possible phlebotomy # in 3 months- labs- H&H; possible phlebotomy # 6 months; MD; cbc/cmp/ldh- possible phlebotomyl Dr.B

## 2019-09-11 NOTE — Progress Notes (Signed)
I connected with Jonathan Evans on 01/22/2021at  2:00 PM EST by video enabled telemedicine visit and verified that I am speaking with the correct person using two identifiers.  I discussed the limitations, risks, security and privacy concerns of performing an evaluation and management service by telemedicine and the availability of in-person appointments. I also discussed with the patient that there may be a patient responsible charge related to this service. The patient expressed understanding and agreed to proceed.    Other persons participating in the visit and their role in the encounter: RN/medical reconciliation Patient's location: Home Provider's location: Office  Oncology History   No history exists.     Chief Complaint: Polycythemia vera   History of present illness:Jonathan Evans 69 y.o.  male with history of polycythemia vera currently on Hydrea is here for follow-up.  Patient has not had any recent phlebotomies.  He continues been Hydrea twice a day.  No diarrhea no abdominal pain no weight loss.  No night sweats.  Observation/objective:  Assessment and plan: Polycythemia vera ([place]) # Polycythemia vera/essential thrombocytosis- JAK-2 POSITIVE. On hydrea  500 mg BID. Awaiting labs today/this week.  # SMOKING/LCS- Not interested.   # I discussed regarding Covid-19 precautions.  I reviewed the vaccine effectiveness and potential side effects in detail.  Also discussed long-term effectiveness and safety profile are unclear at this time.  I discussed December, 2020 ASCO position statement-that all patients are recommended COVID-19 vaccinations [when available]-as long as they do not have allergy to components of the vaccine.  However, I think the benefits of the vaccination outweigh the potential risks. Re: ZOXWR-60 vaccination.  For more information/scheduling recommend call [phone], 8:30am-4:30pm.   # DISPOSITION: # labs next  week/possible phlebotomy # in 3 months- labs- H&H; possible phlebotomy # 6 months; MD; cbc/cmp/ldh- possible phlebotomyl Dr.B   Follow-up instructions:  I discussed the assessment and treatment plan with the patient.  The patient was provided an opportunity to ask questions and all were answered.  The patient agreed with the plan and demonstrated understanding of instructions.  The patient was advised to call back or seek an in person evaluation if the symptoms worsen or if the condition fails to improve as anticipated.  Dr. Charlaine Dalton [place] at [facility] 09/14/2019 1:09 PM

## 2019-09-16 ENCOUNTER — Other Ambulatory Visit: Payer: Self-pay

## 2019-09-16 ENCOUNTER — Other Ambulatory Visit: Payer: Self-pay | Admitting: *Deleted

## 2019-09-16 DIAGNOSIS — D45 Polycythemia vera: Secondary | ICD-10-CM

## 2019-09-17 ENCOUNTER — Inpatient Hospital Stay: Payer: Medicare Other

## 2019-09-17 ENCOUNTER — Other Ambulatory Visit: Payer: Self-pay

## 2019-09-17 DIAGNOSIS — D45 Polycythemia vera: Secondary | ICD-10-CM

## 2019-09-17 LAB — HEMOGLOBIN: Hemoglobin: 14.1 g/dL (ref 13.0–17.0)

## 2019-09-17 LAB — HEMATOCRIT: HCT: 41.3 % (ref 39.0–52.0)

## 2019-10-15 ENCOUNTER — Ambulatory Visit: Payer: Medicare Other | Attending: Internal Medicine

## 2019-10-15 DIAGNOSIS — Z23 Encounter for immunization: Secondary | ICD-10-CM | POA: Insufficient documentation

## 2019-11-11 ENCOUNTER — Ambulatory Visit: Payer: Medicare Other | Attending: Internal Medicine

## 2019-11-11 DIAGNOSIS — Z23 Encounter for immunization: Secondary | ICD-10-CM

## 2019-11-11 NOTE — Progress Notes (Signed)
   Covid-19 Vaccination Clinic  Name:  Jonathan Evans    MRN: [id number] DOB: Nov 25, 1950  11/11/2019  [name] was observed post Covid-19 immunization for 15 minutes without incident. He was provided with Vaccine Information Sheet and instruction to access the V-Safe system.   [name] was instructed to call 911 with any severe reactions post vaccine: Marland Kitchen Difficulty breathing  . Swelling of face and throat  . A fast heartbeat  . A bad rash all over body  . Dizziness and weakness   Immunizations Administered    Name Date Dose VIS Date Route   Pfizer COVID-19 Vaccine 11/11/2019  8:55 AM 0.3 mL 07/31/2019 Intramuscular   Manufacturer: [place]   Lot: [id number]   [place]: [address]-1

## 2019-12-11 ENCOUNTER — Inpatient Hospital Stay: Payer: Medicare Other

## 2019-12-16 ENCOUNTER — Inpatient Hospital Stay: Payer: Medicare Other

## 2019-12-16 ENCOUNTER — Inpatient Hospital Stay: Payer: Medicare Other | Attending: Internal Medicine

## 2019-12-16 ENCOUNTER — Other Ambulatory Visit: Payer: Self-pay

## 2019-12-16 DIAGNOSIS — D45 Polycythemia vera: Secondary | ICD-10-CM | POA: Diagnosis present

## 2019-12-16 LAB — HEMATOCRIT: HCT: 41.3 % (ref 39.0–52.0)

## 2019-12-16 LAB — HEMOGLOBIN: Hemoglobin: 14.4 g/dL (ref 13.0–17.0)

## 2019-12-22 ENCOUNTER — Other Ambulatory Visit: Payer: Self-pay | Admitting: Internal Medicine

## 2019-12-22 DIAGNOSIS — D473 Essential (hemorrhagic) thrombocythemia: Secondary | ICD-10-CM

## 2019-12-22 DIAGNOSIS — D45 Polycythemia vera: Secondary | ICD-10-CM

## 2020-03-10 ENCOUNTER — Other Ambulatory Visit: Payer: Self-pay

## 2020-03-10 ENCOUNTER — Inpatient Hospital Stay: Payer: Medicare Other | Attending: Internal Medicine

## 2020-03-10 DIAGNOSIS — D45 Polycythemia vera: Secondary | ICD-10-CM | POA: Diagnosis not present

## 2020-03-10 LAB — CBC WITH DIFFERENTIAL/PLATELET
Abs Immature Granulocytes: 0.01 10*3/uL (ref 0.00–0.07)
Basophils Absolute: 0 10*3/uL (ref 0.0–0.1)
Basophils Relative: 0 %
Eosinophils Absolute: 0.1 10*3/uL (ref 0.0–0.5)
Eosinophils Relative: 1 %
HCT: 40.2 % (ref 39.0–52.0)
Hemoglobin: 14.3 g/dL (ref 13.0–17.0)
Immature Granulocytes: 0 %
Lymphocytes Relative: 22 %
Lymphs Abs: 1.5 10*3/uL (ref 0.7–4.0)
MCH: 40.9 pg — ABNORMAL HIGH (ref 26.0–34.0)
MCHC: 35.6 g/dL (ref 30.0–36.0)
MCV: 114.9 fL — ABNORMAL HIGH (ref 80.0–100.0)
Monocytes Absolute: 0.4 10*3/uL (ref 0.1–1.0)
Monocytes Relative: 6 %
Neutro Abs: 4.8 10*3/uL (ref 1.7–7.7)
Neutrophils Relative %: 71 %
Platelets: 309 10*3/uL (ref 150–400)
RBC: 3.5 MIL/uL — ABNORMAL LOW (ref 4.22–5.81)
RDW: 13 % (ref 11.5–15.5)
WBC: 6.8 10*3/uL (ref 4.0–10.5)
nRBC: 0 % (ref 0.0–0.2)

## 2020-03-10 LAB — COMPREHENSIVE METABOLIC PANEL
ALT: 20 U/L (ref 0–44)
AST: 19 U/L (ref 15–41)
Albumin: 3.8 g/dL (ref 3.5–5.0)
Alkaline Phosphatase: 97 U/L (ref 38–126)
Anion gap: 7 (ref 5–15)
BUN: 22 mg/dL (ref 8–23)
CO2: 24 mmol/L (ref 22–32)
Calcium: 8.8 mg/dL — ABNORMAL LOW (ref 8.9–10.3)
Chloride: 107 mmol/L (ref 98–111)
Creatinine, Ser: 1.22 mg/dL (ref 0.61–1.24)
GFR calc Af Amer: >60 mL/min (ref >60)
GFR calc non Af Amer: >60 mL/min (ref >60)
Glucose, Bld: 130 mg/dL — ABNORMAL HIGH (ref 70–99)
Potassium: 3.9 mmol/L (ref 3.5–5.1)
Sodium: 138 mmol/L (ref 135–145)
Total Bilirubin: 0.9 mg/dL (ref 0.3–1.2)
Total Protein: 6.7 g/dL (ref 6.5–8.1)

## 2020-03-10 LAB — LACTATE DEHYDROGENASE: LDH: 151 U/L (ref 98–192)

## 2020-03-11 ENCOUNTER — Inpatient Hospital Stay: Payer: Medicare Other

## 2020-03-11 ENCOUNTER — Inpatient Hospital Stay: Payer: Medicare Other | Admitting: Internal Medicine

## 2020-03-14 ENCOUNTER — Inpatient Hospital Stay: Payer: Medicare Other

## 2020-03-14 ENCOUNTER — Inpatient Hospital Stay (HOSPITAL_BASED_OUTPATIENT_CLINIC_OR_DEPARTMENT_OTHER): Payer: Medicare Other | Admitting: Internal Medicine

## 2020-03-14 ENCOUNTER — Encounter: Payer: Self-pay | Admitting: Internal Medicine

## 2020-03-14 DIAGNOSIS — F17210 Nicotine dependence, cigarettes, uncomplicated: Secondary | ICD-10-CM | POA: Diagnosis not present

## 2020-03-14 DIAGNOSIS — D45 Polycythemia vera: Secondary | ICD-10-CM

## 2020-03-14 NOTE — Addendum Note (Signed)
Addended by: Ruthell Rummage A on: 03/14/2020 10:05 AM   Modules accepted: Orders

## 2020-03-14 NOTE — Progress Notes (Signed)
I connected with Jonathan Evans on 03/14/20 at 10:15 AM EDT by video enabled telemedicine visit and verified that I am speaking with the correct person using two identifiers.  I discussed the limitations, risks, security and privacy concerns of performing an evaluation and management service by telemedicine and the availability of in-person appointments. I also discussed with the patient that there may be a patient responsible charge related to this service. The patient expressed understanding and agreed to proceed.    Other persons participating in the visit and their role in the encounter: RN/medical reconciliation Patient's location: home Provider's location: Office  Oncology History   No history exists.   Chief Complaint: Polycythemia Vera.    History of present illness:Jonathan Evans 69 y.o.  male with history of polycythemia vera on Hydrea is here for follow-up.  Patient denies any nausea vomiting diarrhea.  Appetite is good with no weight loss.  No headaches.  No strokes.  No skin rash.  Patient had his Covid shot-post?  He passed out about 2 hours later.  He went on to have second sound without any problems.  No Covid infection.  Observation/objective:hb 147 HCT-40.   Assessment and plan: Polycythemia vera ([place]) # Polycythemia vera/essential thrombocytosis- JAK-2 POSITIVE. On hydrea  500 mg BID. Hb-14;HCT 40; NO phlebotomy at this time.   # SMOKING/LCS- Not interested.   # DISPOSITION: # 6 months; MD; cbc/cmp/ldh- possible phlebotomy- Dr.B   Follow-up instructions:  I discussed the assessment and treatment plan with the patient.  The patient was provided an opportunity to ask questions and all were answered.  The patient agreed with the plan and demonstrated understanding of instructions.  The patient was advised to call back or seek an in person evaluation if the symptoms worsen or if the condition fails to improve as anticipated.  Dr. Charlaine Dalton [facility] at  [facility] 03/14/2020 9:59 AM

## 2020-03-14 NOTE — Assessment & Plan Note (Signed)
#   Polycythemia vera/essential thrombocytosis- JAK-2 POSITIVE. On hydrea  500 mg BID. Hb-14;HCT 40; NO phlebotomy at this time.   # SMOKING/LCS- Not interested.   # DISPOSITION: # 6 months; MD; cbc/cmp/ldh- possible phlebotomy- Dr.B

## 2020-06-11 ENCOUNTER — Other Ambulatory Visit: Payer: Self-pay | Admitting: Internal Medicine

## 2020-06-11 DIAGNOSIS — D473 Essential (hemorrhagic) thrombocythemia: Secondary | ICD-10-CM

## 2020-06-11 DIAGNOSIS — D45 Polycythemia vera: Secondary | ICD-10-CM

## 2020-09-12 ENCOUNTER — Inpatient Hospital Stay: Payer: Medicare Other

## 2020-09-12 ENCOUNTER — Telehealth: Payer: Self-pay | Admitting: Internal Medicine

## 2020-09-12 ENCOUNTER — Inpatient Hospital Stay: Payer: Medicare Other | Admitting: Internal Medicine

## 2020-09-12 NOTE — Telephone Encounter (Signed)
Pt unable to keep todays appointment, please call to reschedule.

## 2020-09-12 NOTE — Telephone Encounter (Signed)
Jonathan Evans- ok to r/s one day next week. Thanks

## 2020-09-23 ENCOUNTER — Inpatient Hospital Stay: Payer: Medicare Other | Admitting: Internal Medicine

## 2020-09-23 ENCOUNTER — Inpatient Hospital Stay: Payer: Medicare Other

## 2020-10-03 ENCOUNTER — Inpatient Hospital Stay: Payer: Medicare Other | Attending: Internal Medicine

## 2020-10-03 DIAGNOSIS — D45 Polycythemia vera: Secondary | ICD-10-CM | POA: Diagnosis present

## 2020-10-03 LAB — COMPREHENSIVE METABOLIC PANEL
ALT: 14 U/L (ref 0–44)
AST: 14 U/L — ABNORMAL LOW (ref 15–41)
Albumin: 3.4 g/dL — ABNORMAL LOW (ref 3.5–5.0)
Alkaline Phosphatase: 110 U/L (ref 38–126)
Anion gap: 11 (ref 5–15)
BUN: 21 mg/dL (ref 8–23)
CO2: 23 mmol/L (ref 22–32)
Calcium: 9.1 mg/dL (ref 8.9–10.3)
Chloride: 103 mmol/L (ref 98–111)
Creatinine, Ser: 1.28 mg/dL — ABNORMAL HIGH (ref 0.61–1.24)
GFR, Estimated: >60 mL/min (ref >60)
Glucose, Bld: 103 mg/dL — ABNORMAL HIGH (ref 70–99)
Potassium: 4.6 mmol/L (ref 3.5–5.1)
Sodium: 137 mmol/L (ref 135–145)
Total Bilirubin: 0.8 mg/dL (ref 0.3–1.2)
Total Protein: 7.3 g/dL (ref 6.5–8.1)

## 2020-10-03 LAB — CBC WITH DIFFERENTIAL/PLATELET
Abs Immature Granulocytes: 0.03 10*3/uL (ref 0.00–0.07)
Basophils Absolute: 0 10*3/uL (ref 0.0–0.1)
Basophils Relative: 1 %
Eosinophils Absolute: 0 10*3/uL (ref 0.0–0.5)
Eosinophils Relative: 0 %
HCT: 34.5 % — ABNORMAL LOW (ref 39.0–52.0)
Hemoglobin: 12.1 g/dL — ABNORMAL LOW (ref 13.0–17.0)
Immature Granulocytes: 0 %
Lymphocytes Relative: 21 %
Lymphs Abs: 1.7 10*3/uL (ref 0.7–4.0)
MCH: 42.8 pg — ABNORMAL HIGH (ref 26.0–34.0)
MCHC: 35.1 g/dL (ref 30.0–36.0)
MCV: 121.9 fL — ABNORMAL HIGH (ref 80.0–100.0)
Monocytes Absolute: 0.7 10*3/uL (ref 0.1–1.0)
Monocytes Relative: 9 %
Neutro Abs: 5.5 10*3/uL (ref 1.7–7.7)
Neutrophils Relative %: 69 %
Platelets: 383 10*3/uL (ref 150–400)
RBC: 2.83 MIL/uL — ABNORMAL LOW (ref 4.22–5.81)
RDW: 13.7 % (ref 11.5–15.5)
WBC: 8 10*3/uL (ref 4.0–10.5)
nRBC: 0 % (ref 0.0–0.2)

## 2020-10-03 LAB — LACTATE DEHYDROGENASE: LDH: 169 U/L (ref 98–192)

## 2020-10-04 ENCOUNTER — Inpatient Hospital Stay (HOSPITAL_BASED_OUTPATIENT_CLINIC_OR_DEPARTMENT_OTHER): Payer: Medicare Other | Admitting: Internal Medicine

## 2020-10-04 ENCOUNTER — Encounter: Payer: Self-pay | Admitting: Internal Medicine

## 2020-10-04 DIAGNOSIS — D45 Polycythemia vera: Secondary | ICD-10-CM

## 2020-10-04 NOTE — Progress Notes (Signed)
Patient scheduled for virtual visit regarding polycythemia, phone assessment completed. Patient denies any concerns today.

## 2020-10-04 NOTE — Assessment & Plan Note (Addendum)
#   Polycythemia vera/essential thrombocytosis- JAK-2 POSITIVE. On hydrea  500 mg BID. Hb-12.1; HCT 42.8 ; NO phlebotomy at this time.  Continue Hydrea at this time.  # Tingling/numbess of feet- ? Etiology- PVD; continue asprin 81/day. Recommend vascular evaluation.   # COVID-19 infection x2 [s/p vaccinated]- s/p Monoclonal antibody therapy [[place]]- improved.   # SMOKING/LCS- Not interested.   # DISPOSITION: # 6 months; MD; cbc/cmp/ldh- possible phlebotomy- Dr.B

## 2020-10-04 NOTE — Progress Notes (Signed)
I connected with Jonathan Evans on 10/04/2020 at  3:30 PM EST by video enabled telemedicine visit and verified that I am speaking with the correct person using two identifiers.  I discussed the limitations, risks, security and privacy concerns of performing an evaluation and management service by telemedicine and the availability of in-person appointments. I also discussed with the patient that there may be a patient responsible charge related to this service. The patient expressed understanding and agreed to proceed.    Other persons participating in the visit and their role in the encounter: RN/medical reconciliation Patient's location: home Provider's location: office  Oncology History   No history exists.    Chief Complaint: PV   History of present illness:Jonathan Evans 70 y.o.  male with history of polycythemia vera on Hydrea is here for follow-up.  Patient states that the interim he was diagnosed with COVID.  Received monoclonal antibody infusions with primary physician.  He denies any cough he denies any shortness of breath.  Unfortunately continues to smoke.  He denies any chest pain.  Denies stroke.   Complains of mild tingling and numbness in extremities.  Observation/objective: Alert & oriented x 3. In No acute distress.   Assessment and plan: Polycythemia vera ([place]) # Polycythemia vera/essential thrombocytosis- JAK-2 POSITIVE. On hydrea  500 mg BID. Hb-12.1; HCT 42.8 ; NO phlebotomy at this time.  Continue Hydrea at this time.  # Tingling/numbess of feet- ? Etiology- PVD; continue asprin 81/day. Recommend vascular evaluation.   # COVID-19 infection x2 [s/p vaccinated]- s/p Monoclonal antibody therapy [[place]]- improved.   # SMOKING/LCS- Not interested.   # DISPOSITION: # 6 months; MD; cbc/cmp/ldh- possible phlebotomy- Dr.B    Follow-up instructions:  I discussed the assessment and treatment plan with the patient.  The patient was provided an opportunity to ask  questions and all were answered.  The patient agreed with the plan and demonstrated understanding of instructions.  The patient was advised to call back or seek an in person evaluation if the symptoms worsen or if the condition fails to improve as anticipated.  Dr. Charlaine Dalton [place] at [facility] 10/06/2020 2:01 PM

## 2020-12-14 ENCOUNTER — Other Ambulatory Visit: Payer: Self-pay | Admitting: Internal Medicine

## 2020-12-14 DIAGNOSIS — D45 Polycythemia vera: Secondary | ICD-10-CM

## 2020-12-14 DIAGNOSIS — D473 Essential (hemorrhagic) thrombocythemia: Secondary | ICD-10-CM

## 2021-04-04 ENCOUNTER — Ambulatory Visit: Payer: Medicare Other | Admitting: Internal Medicine

## 2021-04-04 ENCOUNTER — Inpatient Hospital Stay: Payer: Medicare Other

## 2021-04-04 ENCOUNTER — Other Ambulatory Visit: Payer: Self-pay

## 2021-04-04 ENCOUNTER — Inpatient Hospital Stay: Payer: Medicare Other | Attending: Internal Medicine

## 2021-04-04 ENCOUNTER — Inpatient Hospital Stay (HOSPITAL_BASED_OUTPATIENT_CLINIC_OR_DEPARTMENT_OTHER): Payer: Medicare Other | Admitting: Internal Medicine

## 2021-04-04 ENCOUNTER — Other Ambulatory Visit: Payer: Medicare Other

## 2021-04-04 VITALS — BP 167/86 | HR 75 | Temp 96.3°F | Resp 20 | Ht 67.0 in | Wt 180.8 lb

## 2021-04-04 DIAGNOSIS — D473 Essential (hemorrhagic) thrombocythemia: Secondary | ICD-10-CM | POA: Insufficient documentation

## 2021-04-04 DIAGNOSIS — N183 Chronic kidney disease, stage 3 unspecified: Secondary | ICD-10-CM | POA: Insufficient documentation

## 2021-04-04 DIAGNOSIS — F17210 Nicotine dependence, cigarettes, uncomplicated: Secondary | ICD-10-CM | POA: Insufficient documentation

## 2021-04-04 DIAGNOSIS — Z791 Long term (current) use of non-steroidal anti-inflammatories (NSAID): Secondary | ICD-10-CM | POA: Diagnosis not present

## 2021-04-04 DIAGNOSIS — D45 Polycythemia vera: Secondary | ICD-10-CM | POA: Diagnosis present

## 2021-04-04 DIAGNOSIS — Z79899 Other long term (current) drug therapy: Secondary | ICD-10-CM | POA: Insufficient documentation

## 2021-04-04 DIAGNOSIS — I129 Hypertensive chronic kidney disease with stage 1 through stage 4 chronic kidney disease, or unspecified chronic kidney disease: Secondary | ICD-10-CM | POA: Insufficient documentation

## 2021-04-04 DIAGNOSIS — Z7982 Long term (current) use of aspirin: Secondary | ICD-10-CM | POA: Diagnosis not present

## 2021-04-04 LAB — CBC WITH DIFFERENTIAL/PLATELET
Abs Immature Granulocytes: 0.02 10*3/uL (ref 0.00–0.07)
Basophils Absolute: 0 10*3/uL (ref 0.0–0.1)
Basophils Relative: 1 %
Eosinophils Absolute: 0.1 10*3/uL (ref 0.0–0.5)
Eosinophils Relative: 1 %
HCT: 40 % (ref 39.0–52.0)
Hemoglobin: 14.1 g/dL (ref 13.0–17.0)
Immature Granulocytes: 0 %
Lymphocytes Relative: 27 %
Lymphs Abs: 1.8 10*3/uL (ref 0.7–4.0)
MCH: 42.3 pg — ABNORMAL HIGH (ref 26.0–34.0)
MCHC: 35.3 g/dL (ref 30.0–36.0)
MCV: 120.1 fL — ABNORMAL HIGH (ref 80.0–100.0)
Monocytes Absolute: 0.5 10*3/uL (ref 0.1–1.0)
Monocytes Relative: 7 %
Neutro Abs: 4.2 10*3/uL (ref 1.7–7.7)
Neutrophils Relative %: 64 %
Platelets: 192 10*3/uL (ref 150–400)
RBC: 3.33 MIL/uL — ABNORMAL LOW (ref 4.22–5.81)
RDW: 13.1 % (ref 11.5–15.5)
WBC: 6.6 10*3/uL (ref 4.0–10.5)
nRBC: 0 % (ref 0.0–0.2)

## 2021-04-04 LAB — COMPREHENSIVE METABOLIC PANEL
ALT: 18 U/L (ref 0–44)
AST: 16 U/L (ref 15–41)
Albumin: 3.8 g/dL (ref 3.5–5.0)
Alkaline Phosphatase: 83 U/L (ref 38–126)
Anion gap: 6 (ref 5–15)
BUN: 24 mg/dL — ABNORMAL HIGH (ref 8–23)
CO2: 28 mmol/L (ref 22–32)
Calcium: 9.1 mg/dL (ref 8.9–10.3)
Chloride: 105 mmol/L (ref 98–111)
Creatinine, Ser: 1.37 mg/dL — ABNORMAL HIGH (ref 0.61–1.24)
GFR, Estimated: 56 mL/min — ABNORMAL LOW (ref >60)
Glucose, Bld: 113 mg/dL — ABNORMAL HIGH (ref 70–99)
Potassium: 3.9 mmol/L (ref 3.5–5.1)
Sodium: 139 mmol/L (ref 135–145)
Total Bilirubin: 0.6 mg/dL (ref 0.3–1.2)
Total Protein: 6.8 g/dL (ref 6.5–8.1)

## 2021-04-04 LAB — LACTATE DEHYDROGENASE: LDH: 172 U/L (ref 98–192)

## 2021-04-04 NOTE — Assessment & Plan Note (Addendum)
#   Polycythemia vera/essential thrombocytosis- JAK-2 POSITIVE. On hydrea  500 mg BID. Hb-12.1; HCT 40 ; NO phlebotomy at this time.  No concerns for any transformation.  Continue Hydrea at this time.  # Tingling/numbess of feet- ? Etiology- PVD; stable.  Continue asprin 81/day.  # CKD stage III-GFR 56.  Suspect secondary to ongoing smoking/hypertension.  # HTN: Systolic 789B-.  Discussed that poorly controlled blood pressures can cause a stroke/heart attacks and other cardiovascular problems. recommend compliance with medication/also checking blood pressures at home frequently.keep a log of blood pressures/and bring it to PCPs attention.    # SMOKING/LCS-again discussed importance of smoking cessation; lung cancer screening. Not interested.   # DISPOSITION: #No phlebotomy. # 6 months; MD; cbc/cmp/ldh- possible phlebotomy- Dr.B

## 2021-04-04 NOTE — Progress Notes (Signed)
[facility] NOTE  Patient Care Team: Maryland Pink, MD as PCP - General (Family Medicine)  CHIEF COMPLAINTS/PURPOSE OF CONSULTATION:   # April 2017- ESSENTIAL THROMBOCYTOSIS/-POLYCYTHEMIA VERA- JAK-2 POSITIVE [HCT 52.9/platelets- 295]; Recom phlebotomy/ Hydrea 500 BID / Aspirin 325 mg a day  # Smoker- Recm quitting  HISTORY OF PRESENTING ILLNESS:  Jonathan Evans 70 y.o.  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  Patient has not needed phlebotomy for the last many months.  He is currently taking Hydrea 500 milligrams twice a day.  Denies any nausea vomiting.  Denies any fevers or chills.  Denies any weight loss.  Chronic joint pains shoulder pain not any worse.  Unfortunately continues to smoke.  Review of Systems  Constitutional:  Negative for chills, diaphoresis, fever, malaise/fatigue and weight loss.  HENT:  Negative for nosebleeds and sore throat.   Eyes:  Negative for double vision.  Respiratory:  Negative for cough, hemoptysis, sputum production, shortness of breath and wheezing.   Cardiovascular:  Negative for chest pain, palpitations, orthopnea and leg swelling.  Gastrointestinal:  Negative for abdominal pain, blood in stool, constipation, diarrhea, heartburn, melena, nausea and vomiting.  Genitourinary:  Negative for dysuria, frequency and urgency.  Musculoskeletal:  Positive for back pain and joint pain.  Skin: Negative.  Negative for itching and rash.  Neurological:  Negative for dizziness, tingling, focal weakness, weakness and headaches.  Endo/Heme/Allergies:  Does not bruise/bleed easily.  Psychiatric/Behavioral:  Negative for depression. The patient is not nervous/anxious and does not have insomnia.     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  Denies any alcohol. Social History   Socioeconomic History   Marital status: Married    Spouse name: Not on file   Number of children: Not on file   Years of education: Not on file   Highest education level: Not on file  Occupational History   Not on file  Tobacco Use   Smoking status: Every Day    Packs/day: 1.50    Years: 30.00    Pack years: 45.00    Types: Cigarettes   Smokeless tobacco: Never   Tobacco comments:    uses e-cig  Substance and Sexual Activity   Alcohol use: No    Alcohol/week: 0.0 standard drinks    Comment: rarely   Drug use: No   Sexual activity: Not on file  Other Topics Concern   Not on file  Social History Narrative   Not on file   Social Determinants of Health   Financial Resource Strain: Not on file  Food Insecurity: Not on file  Transportation Needs: Not on file  Physical Activity: Not on file  Stress: Not on file  Social Connections: Not on file  Intimate Partner Violence: Not on file    FAMILY HISTORY: dad- stomach cancer- 27s.  Family History  Problem Relation Age of Onset   Stomach cancer Father     ALLERGIES:  has No Known Allergies.  MEDICATIONS:  Current Outpatient Medications  Medication Sig Dispense Refill   aspirin 325 MG tablet Take  325 mg by mouth daily.     diclofenac (VOLTAREN) 75 MG EC tablet Take 1 tablet by mouth daily.  1   hydroxyurea (HYDREA) 500 MG capsule TAKE 2 CAPSULES BY MOUTH ONCE DAILY WITH FOOD 180 capsule 1   ibuprofen (ADVIL,MOTRIN) 200 MG tablet Take 200 mg by mouth every 6 (six) hours as needed.     venlafaxine XR (EFFEXOR-XR) 75 MG 24 hr capsule Take by mouth.     No current facility-administered medications for this visit.      Marland Kitchen  PHYSICAL EXAMINATION: ECOG PERFORMANCE STATUS: 0 - Asymptomatic  Vitals:   04/04/21 1305   BP: (!) 167/86  Pulse: 75  Resp: 20  Temp: (!) 96.3 F (35.7 C)   Filed Weights   04/04/21 1305  Weight: 180 lb 12.4 oz (82 kg)   Physical Exam HENT:     Head: Normocephalic and atraumatic.     Mouth/Throat:     Pharynx: No oropharyngeal exudate.  Eyes:     Pupils: Pupils are equal, round, and reactive to light.  Cardiovascular:     Rate and Rhythm: Normal rate and regular rhythm.  Pulmonary:     Effort: No respiratory distress.     Breath sounds: No wheezing.  Abdominal:     General: Bowel sounds are normal. There is no distension.     Palpations: Abdomen is soft. There is no mass.     Tenderness: There is no abdominal tenderness. There is no guarding or rebound.  Musculoskeletal:        General: No tenderness. Normal range of motion.     Cervical back: Normal range of motion and neck supple.  Skin:    General: Skin is warm.  Neurological:     Mental Status: He is alert and oriented to person, place, and time.  Psychiatric:        Mood and Affect: Affect normal.     ASSESSMENT & PLAN:   Polycythemia vera ([place]) # Polycythemia vera/essential thrombocytosis- JAK-2 POSITIVE. On hydrea  500 mg BID. Hb-12.1; HCT 40 ; NO phlebotomy at this time.  No concerns for any transformation.  Continue Hydrea at this time.  # Tingling/numbess of feet- ? Etiology- PVD; stable.  Continue asprin 81/day.  # CKD stage III-GFR 56.  Suspect secondary to ongoing smoking/hypertension.  # HTN: Systolic 953U-.  Discussed that poorly controlled blood pressures can cause a stroke/heart attacks and other cardiovascular problems. recommend compliance with medication/also checking blood pressures at home frequently.keep a log of blood pressures/and bring it to PCPs attention.    # SMOKING/LCS-again discussed importance of smoking cessation; lung cancer screening. Not interested.    # DISPOSITION: #No phlebotomy. # 6 months; MD; cbc/cmp/ldh- possible phlebotomy- Dr.B     Cammie Sickle,  MD 04/04/2021 1:35 PM

## 2021-08-07 ENCOUNTER — Other Ambulatory Visit: Payer: Self-pay | Admitting: *Deleted

## 2021-08-07 DIAGNOSIS — D473 Essential (hemorrhagic) thrombocythemia: Secondary | ICD-10-CM

## 2021-08-07 DIAGNOSIS — D45 Polycythemia vera: Secondary | ICD-10-CM

## 2021-08-07 MED ORDER — HYDROXYUREA 500 MG PO CAPS: ORAL_CAPSULE | ORAL | 1 refills | Status: DC

## 2021-08-07 NOTE — Telephone Encounter (Signed)
Patient is out of medicine  ASSESSMENT & PLAN:    Polycythemia vera ([place]) # Polycythemia vera/essential thrombocytosis- JAK-2 POSITIVE. On hydrea  500 mg BID. Hb-12.1; HCT 40 ; NO phlebotomy at this time.  No concerns for any transformation.  Continue Hydrea at this time.   # Tingling/numbess of feet- ? Etiology- PVD; stable.  Continue asprin 81/day.   # CKD stage III-GFR 56.  Suspect secondary to ongoing smoking/hypertension.   # HTN: Systolic 254D-.  Discussed that poorly controlled blood pressures can cause a stroke/heart attacks and other cardiovascular problems. recommend compliance with medication/also checking blood pressures at home frequently.keep a log of blood pressures/and bring it to PCPs attention.     # SMOKING/LCS-again discussed importance of smoking cessation; lung cancer screening. Not interested.    # DISPOSITION: #No phlebotomy. # 6 months; MD; cbc/cmp/ldh- possible phlebotomy- Dr.B      Cammie Sickle, MD 04/04/2021 1:35 PM

## 2021-09-26 ENCOUNTER — Encounter: Payer: Self-pay | Admitting: Internal Medicine

## 2021-10-03 ENCOUNTER — Inpatient Hospital Stay (HOSPITAL_BASED_OUTPATIENT_CLINIC_OR_DEPARTMENT_OTHER): Payer: Medicare Other | Admitting: Internal Medicine

## 2021-10-03 ENCOUNTER — Other Ambulatory Visit: Payer: Self-pay

## 2021-10-03 ENCOUNTER — Inpatient Hospital Stay: Payer: Medicare Other

## 2021-10-03 ENCOUNTER — Inpatient Hospital Stay: Payer: Medicare Other | Attending: Internal Medicine

## 2021-10-03 ENCOUNTER — Encounter: Payer: Self-pay | Admitting: Internal Medicine

## 2021-10-03 DIAGNOSIS — D473 Essential (hemorrhagic) thrombocythemia: Secondary | ICD-10-CM

## 2021-10-03 DIAGNOSIS — F17210 Nicotine dependence, cigarettes, uncomplicated: Secondary | ICD-10-CM | POA: Diagnosis not present

## 2021-10-03 DIAGNOSIS — I129 Hypertensive chronic kidney disease with stage 1 through stage 4 chronic kidney disease, or unspecified chronic kidney disease: Secondary | ICD-10-CM | POA: Diagnosis not present

## 2021-10-03 DIAGNOSIS — Z79899 Other long term (current) drug therapy: Secondary | ICD-10-CM | POA: Diagnosis not present

## 2021-10-03 DIAGNOSIS — Z7982 Long term (current) use of aspirin: Secondary | ICD-10-CM | POA: Insufficient documentation

## 2021-10-03 DIAGNOSIS — G8929 Other chronic pain: Secondary | ICD-10-CM | POA: Insufficient documentation

## 2021-10-03 DIAGNOSIS — M25519 Pain in unspecified shoulder: Secondary | ICD-10-CM | POA: Diagnosis not present

## 2021-10-03 DIAGNOSIS — N183 Chronic kidney disease, stage 3 unspecified: Secondary | ICD-10-CM | POA: Insufficient documentation

## 2021-10-03 DIAGNOSIS — D45 Polycythemia vera: Secondary | ICD-10-CM

## 2021-10-03 LAB — COMPREHENSIVE METABOLIC PANEL
ALT: 20 U/L (ref 0–44)
AST: 16 U/L (ref 15–41)
Albumin: 3.7 g/dL (ref 3.5–5.0)
Alkaline Phosphatase: 90 U/L (ref 38–126)
Anion gap: 7 (ref 5–15)
BUN: 23 mg/dL (ref 8–23)
CO2: 25 mmol/L (ref 22–32)
Calcium: 9.2 mg/dL (ref 8.9–10.3)
Chloride: 106 mmol/L (ref 98–111)
Creatinine, Ser: 1.42 mg/dL — ABNORMAL HIGH (ref 0.61–1.24)
GFR, Estimated: 53 mL/min — ABNORMAL LOW (ref >60)
Glucose, Bld: 114 mg/dL — ABNORMAL HIGH (ref 70–99)
Potassium: 4.2 mmol/L (ref 3.5–5.1)
Sodium: 138 mmol/L (ref 135–145)
Total Bilirubin: 0.3 mg/dL (ref 0.3–1.2)
Total Protein: 7 g/dL (ref 6.5–8.1)

## 2021-10-03 LAB — CBC WITH DIFFERENTIAL/PLATELET
Abs Immature Granulocytes: 0.01 10*3/uL (ref 0.00–0.07)
Basophils Absolute: 0 10*3/uL (ref 0.0–0.1)
Basophils Relative: 1 %
Eosinophils Absolute: 0.1 10*3/uL (ref 0.0–0.5)
Eosinophils Relative: 2 %
HCT: 42.1 % (ref 39.0–52.0)
Hemoglobin: 14.9 g/dL (ref 13.0–17.0)
Immature Granulocytes: 0 %
Lymphocytes Relative: 25 %
Lymphs Abs: 1.6 10*3/uL (ref 0.7–4.0)
MCH: 42.2 pg — ABNORMAL HIGH (ref 26.0–34.0)
MCHC: 35.4 g/dL (ref 30.0–36.0)
MCV: 119.3 fL — ABNORMAL HIGH (ref 80.0–100.0)
Monocytes Absolute: 0.6 10*3/uL (ref 0.1–1.0)
Monocytes Relative: 9 %
Neutro Abs: 4.1 10*3/uL (ref 1.7–7.7)
Neutrophils Relative %: 63 %
Platelets: 415 10*3/uL — ABNORMAL HIGH (ref 150–400)
RBC: 3.53 MIL/uL — ABNORMAL LOW (ref 4.22–5.81)
RDW: 12.8 % (ref 11.5–15.5)
WBC: 6.4 10*3/uL (ref 4.0–10.5)
nRBC: 0 % (ref 0.0–0.2)

## 2021-10-03 LAB — LACTATE DEHYDROGENASE: LDH: 157 U/L (ref 98–192)

## 2021-10-03 MED ORDER — HYDROXYUREA 500 MG PO CAPS: ORAL_CAPSULE | ORAL | 1 refills | Status: DC

## 2021-10-03 NOTE — Progress Notes (Signed)
[facility] NOTE  Patient Care Team: Maryland Pink, MD as PCP - General (Family Medicine)  CHIEF COMPLAINTS/PURPOSE OF CONSULTATION:   # April 2017- ESSENTIAL THROMBOCYTOSIS/-POLYCYTHEMIA VERA- JAK-2 POSITIVE [HCT 52.9/platelets- 563]; Recom phlebotomy/ Hydrea 500 BID / Aspirin 325 mg a day  # Smoker- Recm quitting  HISTORY OF PRESENTING ILLNESS:  Jonathan Evans 71 y.o.  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  Patient has not needed phlebotomy for the last many months.  He is currently taking Hydrea 500 milligrams twice a day.  Denies any nausea vomiting.  Denies any fevers or chills.  Denies any weight loss.  Chronic joint pains shoulder pain not any worse.  Unfortunately continues to smoke.  Review of Systems  Constitutional:  Negative for chills, diaphoresis, fever, malaise/fatigue and weight loss.  HENT:  Negative for nosebleeds and sore throat.   Eyes:  Negative for double vision.  Respiratory:  Negative for cough, hemoptysis, sputum production, shortness of breath and wheezing.   Cardiovascular:  Negative for chest pain, palpitations, orthopnea and leg swelling.  Gastrointestinal:  Negative for abdominal pain, blood in stool, constipation, diarrhea, heartburn, melena, nausea and vomiting.  Genitourinary:  Negative for dysuria, frequency and urgency.  Musculoskeletal:  Positive for back pain and joint pain.  Skin: Negative.  Negative for itching and rash.  Neurological:  Negative for dizziness, tingling, focal weakness, weakness and headaches.  Endo/Heme/Allergies:  Does not bruise/bleed easily.  Psychiatric/Behavioral:  Negative for depression. The patient is not nervous/anxious and does not have insomnia.     MEDICAL HISTORY:  Past Medical History:  Diagnosis Date   Polycythemia vera ([place])    Thrombocytosis    Tobacco abuse     SURGICAL HISTORY: Past Surgical History:  Procedure  Laterality Date   AV FISTULA REPAIR     KNEE ARTHROSCOPY W/ MENISCAL REPAIR     TONSILLECTOMY      SOCIAL HISTORY: Social History   Socioeconomic History   Marital status: Married    Spouse name: Not on file   Number of children: Not on file   Years of education: Not on file   Highest education level: Not on file  Occupational History   Not on file  Tobacco Use   Smoking status: Every Day    Packs/day: 1.50    Years: 30.00    Pack years: 45.00    Types: Cigarettes   Smokeless tobacco: Never   Tobacco comments:    uses e-cig  Substance and Sexual Activity   Alcohol use: No    Alcohol/week: 0.0 standard drinks    Comment: rarely   Drug use: No   Sexual activity: Not on file  Other Topics Concern   Not on file  Social History Narrative    Used to repairs trucks; 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 Continues to smoke.    Social Determinants of Health   Financial Resource Strain: Not on file  Food Insecurity: Not on file  Transportation Needs: Not on file  Physical Activity: Not on file  Stress: Not on file  Social Connections: Not on file  Intimate Partner Violence: Not on file    FAMILY HISTORY: dad- stomach cancer- 82s.  Family History  Problem Relation Age of Onset   Stomach cancer Father     ALLERGIES:  has No Known Allergies.  MEDICATIONS:  Current Outpatient Medications  Medication Sig Dispense Refill   aspirin  325 MG tablet Take 325 mg by mouth daily.     diclofenac (VOLTAREN) 75 MG EC tablet Take 1 tablet by mouth daily.  1   ibuprofen (ADVIL,MOTRIN) 200 MG tablet Take 200 mg by mouth every 6 (six) hours as needed.     venlafaxine XR (EFFEXOR-XR) 75 MG 24 hr capsule Take by mouth.     hydroxyurea (HYDREA) 500 MG capsule TAKE 2 CAPSULES BY MOUTH ONCE DAILY WITH FOOD 180 capsule 1   No current facility-administered medications for this visit.      Marland Kitchen  PHYSICAL EXAMINATION: ECOG PERFORMANCE STATUS: 0 - Asymptomatic  Vitals:    10/03/21 1300  BP: (!) 167/93  Pulse: 77  Resp: 18  Temp: 98.3 F (36.8 C)   Filed Weights   10/03/21 1300  Weight: 186 lb 12.8 oz (84.7 kg)   Physical Exam HENT:     Head: Normocephalic and atraumatic.     Mouth/Throat:     Pharynx: No oropharyngeal exudate.  Eyes:     Pupils: Pupils are equal, round, and reactive to light.  Cardiovascular:     Rate and Rhythm: Normal rate and regular rhythm.  Pulmonary:     Effort: No respiratory distress.     Breath sounds: No wheezing.  Abdominal:     General: Bowel sounds are normal. There is no distension.     Palpations: Abdomen is soft. There is no mass.     Tenderness: There is no abdominal tenderness. There is no guarding or rebound.  Musculoskeletal:        General: No tenderness. Normal range of motion.     Cervical back: Normal range of motion and neck supple.  Skin:    General: Skin is warm.  Neurological:     Mental Status: He is alert and oriented to person, place, and time.  Psychiatric:        Mood and Affect: Affect normal.     ASSESSMENT & PLAN:   Polycythemia vera ([place]) # Polycythemia vera/essential thrombocytosis- JAK-2 POSITIVE. On hydrea  500 mg BID. Hb-12.1; HCT 40 ; NO phlebotomy at this time.  No concerns for any transformation.  Continue Hydrea at this time.  # Tingling/numbess of feet- ? Etiology- PVD; stable;  Continue asprin 81/day.  # CKD stage III-GFR 54.  Suspect secondary to ongoing smoking/hypertension- see below  # HTN: Systolic 212Y-.  Discussed that poorly controlled blood pressures can cause a stroke/heart attacks and other cardiovascular problems-including worsening CKD.  Again recommend compliance with checking blood pressure at home..    # SMOKING/LCS-again discussed importance of smoking cessation; lung cancer screening. Not interested.    * hydrea-mail order # DISPOSITION: #No phlebotomy. # 6 months; MD; cbc/cmp/ldh- possible phlebotomy- Dr.B      Cammie Sickle,  MD 10/03/2021 1:59 PM

## 2021-10-03 NOTE — Assessment & Plan Note (Addendum)
#   Polycythemia vera/essential thrombocytosis- JAK-2 POSITIVE. On hydrea  500 mg BID. Hb-12.1; HCT 40 ; NO phlebotomy at this time.  No concerns for any transformation.  Continue Hydrea at this time.  # Tingling/numbess of feet- ? Etiology- PVD; stable;  Continue asprin 81/day.  # CKD stage III-GFR 54.  Suspect secondary to ongoing smoking/hypertension- see below  # HTN: Systolic 771H-.  Discussed that poorly controlled blood pressures can cause a stroke/heart attacks and other cardiovascular problems-including worsening CKD.  Again recommend compliance with checking blood pressure at home..    # SMOKING/LCS-again discussed importance of smoking cessation; lung cancer screening. Not interested.   * hydrea-mail order # DISPOSITION: #No phlebotomy. # 6 months; MD; cbc/cmp/ldh- possible phlebotomy- Dr.B

## 2021-10-03 NOTE — Progress Notes (Signed)
Per Dr. Rogue Bussing, no phlebotomy procedure needed today.

## 2021-10-03 NOTE — Progress Notes (Signed)
Patient denies new problems/concerns today.    In office blood pressure check is 167/93 today.  He was monitoring at home with consistent good readings so he discontinued home checks.

## 2022-03-31 ENCOUNTER — Other Ambulatory Visit: Payer: Self-pay | Admitting: Nurse Practitioner

## 2022-03-31 DIAGNOSIS — D45 Polycythemia vera: Secondary | ICD-10-CM

## 2022-03-31 DIAGNOSIS — D473 Essential (hemorrhagic) thrombocythemia: Secondary | ICD-10-CM

## 2022-04-03 ENCOUNTER — Inpatient Hospital Stay: Payer: Medicare Other

## 2022-04-03 ENCOUNTER — Inpatient Hospital Stay: Payer: Medicare Other | Attending: Internal Medicine

## 2022-04-03 ENCOUNTER — Encounter: Payer: Self-pay | Admitting: Internal Medicine

## 2022-04-03 ENCOUNTER — Inpatient Hospital Stay (HOSPITAL_BASED_OUTPATIENT_CLINIC_OR_DEPARTMENT_OTHER): Payer: Medicare Other | Admitting: Internal Medicine

## 2022-04-03 DIAGNOSIS — F17210 Nicotine dependence, cigarettes, uncomplicated: Secondary | ICD-10-CM | POA: Diagnosis not present

## 2022-04-03 DIAGNOSIS — Z7982 Long term (current) use of aspirin: Secondary | ICD-10-CM | POA: Insufficient documentation

## 2022-04-03 DIAGNOSIS — R202 Paresthesia of skin: Secondary | ICD-10-CM | POA: Diagnosis not present

## 2022-04-03 DIAGNOSIS — I129 Hypertensive chronic kidney disease with stage 1 through stage 4 chronic kidney disease, or unspecified chronic kidney disease: Secondary | ICD-10-CM | POA: Diagnosis not present

## 2022-04-03 DIAGNOSIS — R200 Anesthesia of skin: Secondary | ICD-10-CM | POA: Insufficient documentation

## 2022-04-03 DIAGNOSIS — D45 Polycythemia vera: Secondary | ICD-10-CM | POA: Diagnosis present

## 2022-04-03 DIAGNOSIS — D473 Essential (hemorrhagic) thrombocythemia: Secondary | ICD-10-CM

## 2022-04-03 DIAGNOSIS — N183 Chronic kidney disease, stage 3 unspecified: Secondary | ICD-10-CM | POA: Diagnosis not present

## 2022-04-03 LAB — COMPREHENSIVE METABOLIC PANEL
ALT: 23 U/L (ref 0–44)
AST: 17 U/L (ref 15–41)
Albumin: 3.8 g/dL (ref 3.5–5.0)
Alkaline Phosphatase: 98 U/L (ref 38–126)
Anion gap: 8 (ref 5–15)
BUN: 24 mg/dL — ABNORMAL HIGH (ref 8–23)
CO2: 24 mmol/L (ref 22–32)
Calcium: 8.9 mg/dL (ref 8.9–10.3)
Chloride: 108 mmol/L (ref 98–111)
Creatinine, Ser: 1.37 mg/dL — ABNORMAL HIGH (ref 0.61–1.24)
GFR, Estimated: 55 mL/min — ABNORMAL LOW (ref >60)
Glucose, Bld: 107 mg/dL — ABNORMAL HIGH (ref 70–99)
Potassium: 4.3 mmol/L (ref 3.5–5.1)
Sodium: 140 mmol/L (ref 135–145)
Total Bilirubin: 0.6 mg/dL (ref 0.3–1.2)
Total Protein: 7 g/dL (ref 6.5–8.1)

## 2022-04-03 LAB — CBC WITH DIFFERENTIAL/PLATELET
Abs Immature Granulocytes: 0.02 10*3/uL (ref 0.00–0.07)
Basophils Absolute: 0 10*3/uL (ref 0.0–0.1)
Basophils Relative: 1 %
Eosinophils Absolute: 0.1 10*3/uL (ref 0.0–0.5)
Eosinophils Relative: 2 %
HCT: 41.7 % (ref 39.0–52.0)
Hemoglobin: 14.6 g/dL (ref 13.0–17.0)
Immature Granulocytes: 0 %
Lymphocytes Relative: 27 %
Lymphs Abs: 1.7 10*3/uL (ref 0.7–4.0)
MCH: 42.8 pg — ABNORMAL HIGH (ref 26.0–34.0)
MCHC: 35 g/dL (ref 30.0–36.0)
MCV: 122.3 fL — ABNORMAL HIGH (ref 80.0–100.0)
Monocytes Absolute: 0.6 10*3/uL (ref 0.1–1.0)
Monocytes Relative: 10 %
Neutro Abs: 3.7 10*3/uL (ref 1.7–7.7)
Neutrophils Relative %: 60 %
Platelets: 347 10*3/uL (ref 150–400)
RBC: 3.41 MIL/uL — ABNORMAL LOW (ref 4.22–5.81)
RDW: 13.5 % (ref 11.5–15.5)
WBC: 6.2 10*3/uL (ref 4.0–10.5)
nRBC: 0 % (ref 0.0–0.2)

## 2022-04-03 LAB — LACTATE DEHYDROGENASE: LDH: 155 U/L (ref 98–192)

## 2022-04-03 NOTE — Progress Notes (Signed)
HCT 41.7. No phlebotomy required.

## 2022-04-03 NOTE — Progress Notes (Signed)
Patient denies new problems/concerns today.   °

## 2022-04-03 NOTE — Progress Notes (Signed)
[facility] NOTE  Patient Care Team: Maryland Pink, MD as PCP - General (Family Medicine) Cammie Sickle, MD as Consulting Physician (Internal Medicine)  CHIEF COMPLAINTS/PURPOSE OF CONSULTATION:   # April 2017- ESSENTIAL THROMBOCYTOSIS/-POLYCYTHEMIA VERA- JAK-2 POSITIVE [HCT 52.9/platelets- 517]; Recom phlebotomy/ Hydrea 500 BID / Aspirin 325 mg a day  # Smoker- Recm quitting  HISTORY OF PRESENTING ILLNESS: alone; ambulating independently  Jonathan Evans 71 y.o.  male  history of Jak 2 positive polycythemia vera/ET currently on Hydrea is here for follow-up.  Patient has not needed phlebotomy for the last few years.  He is currently taking Hydrea 500 milligrams twice a day.  Denies any nausea vomiting.  Denies any fevers or chills.  Denies any weight loss.  Chronic joint pains shoulder pain not any worse.  Unfortunately continues to smoke.  Review of Systems  Constitutional:  Negative for chills, diaphoresis, fever, malaise/fatigue and weight loss.  HENT:  Negative for nosebleeds and sore throat.   Eyes:  Negative for double vision.  Respiratory:  Negative for cough, hemoptysis, sputum production, shortness of breath and wheezing.   Cardiovascular:  Negative for chest pain, palpitations, orthopnea and leg swelling.  Gastrointestinal:  Negative for abdominal pain, blood in stool, constipation, diarrhea, heartburn, melena, nausea and vomiting.  Genitourinary:  Negative for dysuria, frequency and urgency.  Musculoskeletal:  Positive for back pain and joint pain.  Skin: Negative.  Negative for itching and rash.  Neurological:  Negative for dizziness, tingling, focal weakness, weakness and headaches.  Endo/Heme/Allergies:  Does not bruise/bleed easily.  Psychiatric/Behavioral:  Negative for depression. The patient is not nervous/anxious and does not have insomnia.      MEDICAL HISTORY:  Past Medical History:  Diagnosis Date   Polycythemia vera ([place])     Thrombocytosis    Tobacco abuse     SURGICAL HISTORY: Past Surgical History:  Procedure Laterality Date   AV FISTULA REPAIR     KNEE ARTHROSCOPY W/ MENISCAL REPAIR     TONSILLECTOMY      SOCIAL HISTORY: Social History   Socioeconomic History   Marital status: Married    Spouse name: Not on file   Number of children: Not on file   Years of education: Not on file   Highest education level: Not on file  Occupational History   Not on file  Tobacco Use   Smoking status: Every Day    Packs/day: 1.50    Years: 30.00    Total pack years: 45.00    Types: Cigarettes   Smokeless tobacco: Never   Tobacco comments:    uses e-cig  Substance and Sexual Activity   Alcohol use: No    Alcohol/week: 0.0 standard drinks of alcohol    Comment: rarely   Drug use: No   Sexual activity: Not on file  Other Topics Concern   Not on file  Social History Narrative    Used to repairs trucks; 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 Continues to smoke.    Social Determinants of Health   Financial Resource Strain: Not on file  Food Insecurity: Not on file  Transportation Needs: Not on file  Physical Activity: Not on file  Stress: Not on file  Social Connections: Not on file  Intimate Partner Violence: Not on file    FAMILY HISTORY: dad- stomach cancer- 66s.  Family History  Problem Relation Age of Onset   Stomach cancer Father     ALLERGIES:  has No  Known Allergies.  MEDICATIONS:  Current Outpatient Medications  Medication Sig Dispense Refill   aspirin 325 MG tablet Take 325 mg by mouth daily.     diclofenac (VOLTAREN) 75 MG EC tablet Take 1 tablet by mouth daily.  1   hydroxyurea (HYDREA) 500 MG capsule TAKE 2 CAPSULES BY MOUTH ONCE DAILY WITH FOOD 180 capsule 1   ibuprofen (ADVIL,MOTRIN) 200 MG tablet Take 200 mg by mouth every 6 (six) hours as needed.     venlafaxine XR (EFFEXOR-XR) 75 MG 24 hr capsule Take by mouth.     No current facility-administered  medications for this visit.      Marland Kitchen  PHYSICAL EXAMINATION: ECOG PERFORMANCE STATUS: 0 - Asymptomatic  Vitals:   04/03/22 1300  BP: (!) 162/90  Pulse: 78  Resp: 18  Temp: 98.1 F (36.7 C)   Filed Weights   04/03/22 1300  Weight: 182 lb 12.8 oz (82.9 kg)   Physical Exam HENT:     Head: Normocephalic and atraumatic.     Mouth/Throat:     Pharynx: No oropharyngeal exudate.  Eyes:     Pupils: Pupils are equal, round, and reactive to light.  Cardiovascular:     Rate and Rhythm: Normal rate and regular rhythm.  Pulmonary:     Effort: No respiratory distress.     Breath sounds: No wheezing.  Abdominal:     General: Bowel sounds are normal. There is no distension.     Palpations: Abdomen is soft. There is no mass.     Tenderness: There is no abdominal tenderness. There is no guarding or rebound.  Musculoskeletal:        General: No tenderness. Normal range of motion.     Cervical back: Normal range of motion and neck supple.  Skin:    General: Skin is warm.  Neurological:     Mental Status: He is alert and oriented to person, place, and time.  Psychiatric:        Mood and Affect: Affect normal.      ASSESSMENT & PLAN:   Polycythemia vera ([place]) # Polycythemia vera/essential thrombocytosis- JAK-2 POSITIVE. On hydrea  500 mg BID. Hb-12.1; HCT 41 ; NO phlebotomy at this time.  No concerns for any transformation.  Continue Hydrea at this time; STABLE.   # Tingling/numbess of feet- ? Etiology- PVD; stable;  Continue asprin 81/day.  # CKD stage III-GFR 54.  Suspect secondary to ongoing smoking/hypertension- see below- STABLE.   # HTN: Systolic 086P-.  At home 130-140s.- continue close monitoring.   * hydrea-mail order # DISPOSITION: #No phlebotomy. # 6 months; MD; cbc/cmp/ldh- possible phlebotomy- Dr.B      Cammie Sickle, MD 04/03/2022 2:10 PM

## 2022-04-03 NOTE — Assessment & Plan Note (Signed)
#   Polycythemia vera/essential thrombocytosis- JAK-2 POSITIVE. On hydrea  500 mg BID. Hb-12.1; HCT 41 ; NO phlebotomy at this time.  No concerns for any transformation.  Continue Hydrea at this time; STABLE.   # Tingling/numbess of feet- ? Etiology- PVD; stable;  Continue asprin 81/day.  # CKD stage III-GFR 54.  Suspect secondary to ongoing smoking/hypertension- see below- STABLE.   # HTN: Systolic 116F-.  At home 130-140s.- continue close monitoring.   * hydrea-mail order # DISPOSITION: #No phlebotomy. # 6 months; MD; cbc/cmp/ldh- possible phlebotomy- Dr.B

## 2022-05-03 ENCOUNTER — Encounter: Payer: Self-pay | Admitting: Ophthalmology

## 2022-05-09 NOTE — Discharge Instructions (Signed)

## 2022-05-11 NOTE — Anesthesia Preprocedure Evaluation (Signed)
Anesthesia Evaluation  Patient identified by MRN, date of birth, ID band Patient awake    Reviewed: Allergy & Precautions, NPO status , Patient's Chart, lab work & pertinent test results  History of Anesthesia Complications Negative for: history of anesthetic complications  Airway Mallampati: III  TM Distance: >3 FB Neck ROM: full    Dental  (+) Chipped   Pulmonary neg pulmonary ROS, Current Smoker,    Pulmonary exam normal        Cardiovascular negative cardio ROS Normal cardiovascular exam     Neuro/Psych negative neurological ROS  negative psych ROS   GI/Hepatic negative GI ROS, Neg liver ROS,   Endo/Other  negative endocrine ROS  Renal/GU      Musculoskeletal   Abdominal   Peds  Hematology negative hematology ROS (+) H/o PV and thrombocytosis   Anesthesia Other Findings Past Medical History: No date: Polycythemia vera ([place]) No date: Thrombocytosis No date: Tobacco abuse  Past Surgical History: 2001: CLOSURE ENTEROVESICAL FISTULA W/BOWEL &/OR BLADDER RESECTION No date: KNEE ARTHROSCOPY W/ MENISCAL REPAIR; Right No date: TONSILLECTOMY  BMI    Body Mass Index: 28.13 kg/m      Reproductive/Obstetrics negative OB ROS                             Anesthesia Physical Anesthesia Plan  ASA: 2  Anesthesia Plan: MAC   Post-op Pain Management:    Induction:   PONV Risk Score and Plan:   Airway Management Planned:   Additional Equipment:   Intra-op Plan:   Post-operative Plan:   Informed Consent:   Plan Discussed with: Anesthesiologist, CRNA and Surgeon  Anesthesia Plan Comments:         Anesthesia Quick Evaluation

## 2022-05-13 ENCOUNTER — Other Ambulatory Visit: Payer: Self-pay | Admitting: Internal Medicine

## 2022-05-13 DIAGNOSIS — D473 Essential (hemorrhagic) thrombocythemia: Secondary | ICD-10-CM

## 2022-05-13 DIAGNOSIS — D45 Polycythemia vera: Secondary | ICD-10-CM

## 2022-05-14 ENCOUNTER — Ambulatory Visit: Payer: Medicare Other | Admitting: Anesthesiology

## 2022-05-14 ENCOUNTER — Ambulatory Visit (AMBULATORY_SURGERY_CENTER): Payer: Medicare Other | Admitting: Anesthesiology

## 2022-05-14 ENCOUNTER — Encounter: Payer: Self-pay | Admitting: Internal Medicine

## 2022-05-14 ENCOUNTER — Other Ambulatory Visit: Payer: Self-pay

## 2022-05-14 ENCOUNTER — Ambulatory Visit
Admission: RE | Admit: 2022-05-14 | Discharge: 2022-05-14 | Disposition: A | Discharge status: Home / Self Care | Payer: Medicare Other | Attending: Ophthalmology | Admitting: Ophthalmology

## 2022-05-14 ENCOUNTER — Encounter
Admission: RE | Disposition: A | Discharge status: Home / Self Care | Payer: Self-pay | Source: Home / Self Care | Attending: Ophthalmology

## 2022-05-14 DIAGNOSIS — M1990 Unspecified osteoarthritis, unspecified site: Secondary | ICD-10-CM

## 2022-05-14 DIAGNOSIS — D75839 Thrombocytosis, unspecified: Secondary | ICD-10-CM | POA: Insufficient documentation

## 2022-05-14 DIAGNOSIS — D45 Polycythemia vera: Secondary | ICD-10-CM | POA: Insufficient documentation

## 2022-05-14 DIAGNOSIS — F17210 Nicotine dependence, cigarettes, uncomplicated: Secondary | ICD-10-CM

## 2022-05-14 DIAGNOSIS — Z862 Personal history of diseases of the blood and blood-forming organs and certain disorders involving the immune mechanism: Secondary | ICD-10-CM | POA: Diagnosis not present

## 2022-05-14 DIAGNOSIS — H2511 Age-related nuclear cataract, right eye: Secondary | ICD-10-CM

## 2022-05-14 HISTORY — PX: CATARACT EXTRACTION W/PHACO: SHX586

## 2022-05-14 SURGERY — PHACOEMULSIFICATION, CATARACT, WITH IOL INSERTION
Anesthesia: Monitor Anesthesia Care | Site: Eye | Laterality: Right

## 2022-05-14 MED ORDER — ARMC OPHTHALMIC DILATING DROPS
1.0000 | OPHTHALMIC | Status: DC | PRN
  Administered 2022-05-14 (×3): 1 via OPHTHALMIC

## 2022-05-14 MED ORDER — SIGHTPATH DOSE#1 NA HYALUR & NA CHOND-NA HYALUR IO KIT
PACK | INTRAOCULAR | Status: DC | PRN
  Administered 2022-05-14: 1 via OPHTHALMIC

## 2022-05-14 MED ORDER — FENTANYL CITRATE (PF) 100 MCG/2ML IJ SOLN
INTRAMUSCULAR | Status: DC | PRN
  Administered 2022-05-14: 100 ug via INTRAVENOUS

## 2022-05-14 MED ORDER — TETRACAINE HCL 0.5 % OP SOLN
1.0000 [drp] | OPHTHALMIC | Status: DC | PRN
  Administered 2022-05-14 (×3): 1 [drp] via OPHTHALMIC

## 2022-05-14 MED ORDER — SIGHTPATH DOSE#1 BSS IO SOLN
INTRAOCULAR | Status: DC | PRN
  Administered 2022-05-14: 118 mL via OPHTHALMIC

## 2022-05-14 MED ORDER — LIDOCAINE HCL (PF) 2 % IJ SOLN
INTRAOCULAR | Status: DC | PRN
  Administered 2022-05-14: 1 mL via INTRAOCULAR

## 2022-05-14 MED ORDER — MIDAZOLAM HCL 2 MG/2ML IJ SOLN
INTRAMUSCULAR | Status: DC | PRN
  Administered 2022-05-14: 2 mg via INTRAVENOUS

## 2022-05-14 MED ORDER — MOXIFLOXACIN HCL 0.5 % OP SOLN
OPHTHALMIC | Status: DC | PRN
  Administered 2022-05-14: 0.2 mL via OPHTHALMIC

## 2022-05-14 MED ORDER — SIGHTPATH DOSE#1 BSS IO SOLN
INTRAOCULAR | Status: DC | PRN
  Administered 2022-05-14: 15 mL

## 2022-05-14 SURGICAL SUPPLY — 13 items
CATARACT SUITE SIGHTPATH (MISCELLANEOUS) ×1 IMPLANT
DISSECTOR HYDRO NUCLEUS 50X22 (MISCELLANEOUS) ×1 IMPLANT
FEE CATARACT SUITE SIGHTPATH (MISCELLANEOUS) ×1 IMPLANT
GLOVE SURG GAMMEX PI TX LF 7.5 (GLOVE) ×1 IMPLANT
GLOVE SURG SYN 8.5  E (GLOVE) ×1
GLOVE SURG SYN 8.5 E (GLOVE) ×1 IMPLANT
GLOVE SURG SYN 8.5 PF PI (GLOVE) ×1 IMPLANT
LENS IOL TECNIS EYHANCE 20.5 (Intraocular Lens) IMPLANT
NDL FILTER BLUNT 18X1 1/2 (NEEDLE) ×1 IMPLANT
NEEDLE FILTER BLUNT 18X1 1/2 (NEEDLE) ×1 IMPLANT
SYR 3ML LL SCALE MARK (SYRINGE) ×1 IMPLANT
SYR 5ML LL (SYRINGE) ×1 IMPLANT
WATER STERILE IRR 250ML POUR (IV SOLUTION) ×1 IMPLANT

## 2022-05-14 NOTE — Op Note (Signed)
OPERATIVE NOTE  Jonathan Evans [id number] 05/14/2022   PREOPERATIVE DIAGNOSIS:  Nuclear sclerotic cataract right eye.  H25.11   POSTOPERATIVE DIAGNOSIS:    Nuclear sclerotic cataract right eye.     PROCEDURE:  Phacoemusification with posterior chamber intraocular lens placement of the right eye   LENS:   Implant Name Type Inv. Item Serial No. Manufacturer Lot No. LRB No. Used Action  LENS IOL TECNIS EYHANCE 20.5 - [id number] Intraocular Lens LENS IOL TECNIS EYHANCE 20.5 [id number] [employer]  Right 1 Implanted       Procedure(s): CATARACT EXTRACTION PHACO AND INTRAOCULAR LENS PLACEMENT (IOC) RIGHT 12.65 01:10.7 (Right)  DIB00 +20.5   ULTRASOUND TIME: 1 minutes 10 seconds.  CDE 12.65   SURGEON:  Benay Pillow, MD, MPH  ANESTHESIOLOGIST: Anesthesiologist: [place] Ouch, MD CRNA: Louanne Belton, CRNA   ANESTHESIA:  Topical with tetracaine drops augmented with 1% preservative-free intracameral lidocaine.  ESTIMATED BLOOD LOSS: less than 1 mL.   COMPLICATIONS:  None.   DESCRIPTION OF PROCEDURE:  The patient was identified in the holding room and transported to the operating room and placed in the supine position under the operating microscope.  The right eye was identified as the operative eye and it was prepped and draped in the usual sterile ophthalmic fashion.   A 1.0 millimeter clear-corneal paracentesis was made at the 10:30 position. 0.5 ml of preservative-free 1% lidocaine with epinephrine was injected into the anterior chamber.  The anterior chamber was filled with viscoelastic.  A 2.4 millimeter keratome was used to make a near-clear corneal incision at the 8:00 position.  A curvilinear capsulorrhexis was made with a cystotome and capsulorrhexis forceps.  Balanced salt solution was used to hydrodissect and hydrodelineate the nucleus.   Phacoemulsification was then used in stop and chop fashion to remove the lens nucleus and epinucleus.  The remaining cortex was  then removed using the irrigation and aspiration handpiece. Viscoelastic was then placed into the capsular bag to distend it for lens placement.  A lens was then injected into the capsular bag.  The remaining viscoelastic was aspirated.   Wounds were hydrated with balanced salt solution.  The anterior chamber was inflated to a physiologic pressure with balanced salt solution.   Intracameral vigamox 0.1 mL undiluted was injected into the eye and a drop placed onto the ocular surface.  No wound leaks were noted.  The patient was taken to the recovery room in stable condition without complications of anesthesia or surgery  Benay Pillow 05/14/2022, 12:17 PM

## 2022-05-14 NOTE — Anesthesia Postprocedure Evaluation (Signed)
Anesthesia Post Note  Patient: Jonathan Evans  Procedure(s) Performed: CATARACT EXTRACTION PHACO AND INTRAOCULAR LENS PLACEMENT (IOC) RIGHT 12.65 01:10.7 (Right: Eye)     Patient location during evaluation: PACU Anesthesia Type: MAC Level of consciousness: awake and alert Pain management: pain level controlled Vital Signs Assessment: post-procedure vital signs reviewed and stable Respiratory status: spontaneous breathing, nonlabored ventilation and respiratory function stable Cardiovascular status: blood pressure returned to baseline and stable Postop Assessment: no apparent nausea or vomiting Anesthetic complications: no   No notable events documented.  Iran Ouch

## 2022-05-14 NOTE — H&P (Signed)
[facility]   Primary Care Physician:  [place] Pink, MD Ophthalmologist: Dr. Benay Pillow  Pre-Procedure History & Physical: HPI:  Jonathan Evans is a 71 y.o. male here for cataract surgery.   Past Medical History:  Diagnosis Date   Polycythemia vera ([place])    Thrombocytosis    Tobacco abuse     Past Surgical History:  Procedure Laterality Date   CLOSURE ENTEROVESICAL FISTULA W/BOWEL &/OR BLADDER RESECTION  2001   KNEE ARTHROSCOPY W/ MENISCAL REPAIR Right    TONSILLECTOMY      Prior to Admission medications   Medication Sig Start Date End Date Taking? Authorizing Provider  aspirin 325 MG tablet Take 325 mg by mouth daily.   Yes [provider]  diclofenac (VOLTAREN) 75 MG EC tablet Take 1 tablet by mouth daily. 04/15/16  Yes [provider]  hydroxyurea (HYDREA) 500 MG capsule TAKE 2 CAPSULES BY MOUTH ONCE DAILY WITH FOOD 10/03/21  Yes Cammie Sickle, MD  ibuprofen (ADVIL,MOTRIN) 200 MG tablet Take 200 mg by mouth every 6 (six) hours as needed.   Yes [provider]    Allergies as of 04/25/2022   (No Known Allergies)    Family History  Problem Relation Age of Onset   Stomach cancer Father     Social History   Socioeconomic History   Marital status: Married    Spouse name: Not on file   Number of children: Not on file   Years of education: Not on file   Highest education level: Not on file  Occupational History   Not on file  Tobacco Use   Smoking status: Every Day    Packs/day: 1.50    Years: 30.00    Total pack years: 45.00    Types: Cigarettes   Smokeless tobacco: Never   Tobacco comments:    uses e-cig  Substance and Sexual Activity   Alcohol use: No    Alcohol/week: 0.0 standard drinks of alcohol    Comment: rarely   Drug use: No   Sexual activity: Not on file  Other Topics Concern   Not on file  Social History Narrative    Used to repairs trucks; 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  Denies any alcohol.  Continues to smoke.    Social Determinants of Health   Financial Resource Strain: Not on file  Food Insecurity: Not on file  Transportation Needs: Not on file  Physical Activity: Not on file  Stress: Not on file  Social Connections: Not on file  Intimate Partner Violence: Not on file    Review of Systems: See HPI, otherwise negative ROS  Physical Exam: Pulse 72   Temp 98.2 F (36.8 C) (Temporal)   Resp 13   Ht '5\' 8"'$  (1.727 m)   Wt 83 kg   SpO2 98%   BMI 27.83 kg/m  General:   Alert, cooperative in NAD Head:  Normocephalic and atraumatic. Respiratory:  Normal work of breathing. Cardiovascular:  RRR  Impression/Plan: Jonathan Evans is here for cataract surgery.  Risks, benefits, limitations, and alternatives regarding cataract surgery have been reviewed with the patient.  Questions have been answered.  All parties agreeable.   Benay Pillow, MD  05/14/2022, 11:48 AM

## 2022-05-14 NOTE — Telephone Encounter (Signed)
CBC with Differential/Platelet Order: [id number] Status: Final result    Visible to patient: No (inaccessible in MyChart)    Next appt: 10/10/2022 at 12:45 PM in Oncology ([facility])    Dx: Essential thrombocythemia ([place]); Poly...    0 Result Notes           Component Ref Range & Units 1 mo ago (04/03/22) 7 mo ago (10/03/21) 1 yr ago (04/04/21) 1 yr ago (10/03/20) 2 yr ago (03/10/20) 2 yr ago (12/16/19) 2 yr ago (12/16/19)  WBC 4.0 - 10.5 K/uL 6.2  6.4  6.6  8.0  6.8     RBC 4.22 - 5.81 MIL/uL 3.41 Low   3.53 Low   3.33 Low   2.83 Low   3.50 Low      Hemoglobin 13.0 - 17.0 g/dL 14.6  14.9  14.1  12.1 Low   14.3   14.4 CM   HCT 39.0 - 52.0 % 41.7  42.1  40.0  34.5 Low   40.2  41.3 CM    MCV 80.0 - 100.0 fL 122.3 High   119.3 High   120.1 High   121.9 High   114.9 High      MCH 26.0 - 34.0 pg 42.8 High   42.2 High   42.3 High   42.8 High   40.9 High      MCHC 30.0 - 36.0 g/dL 35.0  35.4  35.3  35.1  35.6     RDW 11.5 - 15.5 % 13.5  12.8  13.1  13.7  13.0     Platelets 150 - 400 K/uL 347  415 High   192  383  309     nRBC 0.0 - 0.2 % 0.0  0.0  0.0  0.0  0.0     Neutrophils Relative % % 60  63  64  69  71     Neutro Abs 1.7 - 7.7 K/uL 3.7  4.1  4.2  5.5  4.8     Lymphocytes Relative % '27  25  27  21  22     '$ Lymphs Abs 0.7 - 4.0 K/uL 1.7  1.6  1.8  1.7  1.5     Monocytes Relative % '10  9  7  9  6     '$ Monocytes Absolute 0.1 - 1.0 K/uL 0.6  0.6  0.5  0.7  0.4     Eosinophils Relative % '2  2  1  '$ 0  1     Eosinophils Absolute 0.0 - 0.5 K/uL 0.1  0.1  0.1  0.0  0.1     Basophils Relative % '1  1  1  1  '$ 0     Basophils Absolute 0.0 - 0.1 K/uL 0.0  0.0  0.0  0.0  0.0     Immature Granulocytes % 0  0  0  0  0     Abs Immature Granulocytes 0.00 - 0.07 K/uL 0.02  0.01 CM  0.02 CM  0.03 CM  0.01 CM     Comment: Performed at [facility], [address] [place], [place] [address]  Resulting Agency  [facility] CL[facility] [place] [facility] [place] [facility] [place] [facility] [place] [facility]          Specimen Collected: 04/03/22 12:54 Last Resulted: 04/03/22 13:07      Lab Flowsheet    Order Details    View Encounter  Lab and Collection Details    Routing    Result History    View All Conversations on this Encounter      CM=Additional comments      Result Care Coordination   Patient Communication   Add Comments   Not seen Back to Top       Other Results from 04/03/2022  Lactate dehydrogenase Order: [id number] Status: Final result    Visible to patient: No (inaccessible in [place])    Next appt: 10/10/2022 at 12:45 PM in Oncology ([facility])    Dx: Essential thrombocythemia ([place]); Poly...    0 Result Notes          Component Ref Range & Units 1 mo ago (04/03/22) 7 mo ago (10/03/21) 1 yr ago (04/04/21) 1 yr ago (10/03/20) 2 yr ago (03/10/20) 6 yr ago (12/06/15)  LDH 98 - 192 U/L 155  157 CM  172 CM  169 CM  151 CM  177   Comment: Performed at [facility], [address] [place], [place] [address]  Resulting Agency  [place] CL[facility] [place] [facility] LAB [place] [facility] [place] [facility] [place] [facility] [place] [facility]         Specimen Collected: 04/03/22 12:54 Last Resulted: 04/03/22 13:26      Lab Flowsheet    Order Details    View Encounter    Lab and Collection Details    Routing    Result History    View All Conversations on this Encounter      CM=Additional comments      Result Care Coordination   Patient Communication   Add Comments   Not seen Back to Top          Contains abnormal data Comprehensive metabolic panel Order: [id number] Status: Final result    Visible to patient: No (inaccessible in MyChart)    Next appt: 10/10/2022 at 12:45 PM in Oncology ([facility])    Dx: Essential thrombocythemia ([place]); Poly...    0 Result Notes           Component Ref Range & Units 1 mo ago (04/03/22) 7 mo ago (10/03/21) 1 yr ago (04/04/21) 1 yr ago (10/03/20) 2 yr ago (03/10/20) 3 yr ago (02/09/19) 3 yr ago (08/11/18)  Sodium 135 - 145 mmol/L 140  138  139  137  138  141   142   Potassium 3.5 - 5.1 mmol/L 4.3  4.2  3.9  4.6  3.9  4.0  3.9   Chloride 98 - 111 mmol/L 108  106  105  103  107  107  108   CO2 22 - 32 mmol/L '24  25  28  23  24  26  26   '$ Glucose, Bld 70 - 99 mg/dL 107 High   114 High  CM  113 High  CM  103 High  CM  130 High  CM  140 High   121 High    Comment: Glucose reference range applies only to samples taken after fasting for at least 8 hours.  BUN 8 - 23 mg/dL 24 High   23  24 High   '21  22  20  23   '$ Creatinine, Ser 0.61 - 1.24 mg/dL 1.37 High   1.42 High   1.37 High   1.28 High   1.22  1.32 High   1.16   Calcium 8.9 - 10.3 mg/dL 8.9  9.2  9.1  9.1  8.8 Low  8.8 Low   8.7 Low    Total Protein 6.5 - 8.1 g/dL 7.0  7.0  6.8  7.3  6.7  6.6  6.5   Albumin 3.5 - 5.0 g/dL 3.8  3.7  3.8  3.4 Low   3.8  3.8  3.8   AST 15 - 41 U/L '17  16  16  14 '$ Low   '19  19  17   '$ ALT 0 - 44 U/L '23  20  18  14  20  22  20   '$ Alkaline Phosphatase 38 - 126 U/L 98  90  83  110  97  100  89   Total Bilirubin 0.3 - 1.2 mg/dL 0.6  0.3  0.6  0.8  0.9  0.5  0.5   GFR, Estimated >60 mL/min 55 Low   53 Low  CM  56 Low  CM  >60 CM      Comment: (NOTE)  Calculated using the CKD-EPI Creatinine Equation (2021)   Anion gap 5 - '15 8  7 '$ CM  6 CM  11 CM  7 CM  8 CM  8 CM   Comment: Performed at [facility], [address] [place], [place] [address]  Resulting Agency  [facility] CLIN LAB [place] CLIN LAB [place] CLIN LAB [place] CLIN LAB [place] [facility] LAB [place] CL[facility] LAB [place] [facility]         Specimen Collected: 04/03/22 12:54 Last Resulted: 04/03/22 13:26

## 2022-05-14 NOTE — Transfer of Care (Signed)
Immediate Anesthesia Transfer of Care Note  Patient: Jonathan Evans  Procedure(s) Performed: CATARACT EXTRACTION PHACO AND INTRAOCULAR LENS PLACEMENT (IOC) RIGHT 12.65 01:10.7 (Right: Eye)  Patient Location: PACU  Anesthesia Type: MAC  Level of Consciousness: awake, alert  and patient cooperative  Airway and Oxygen Therapy: Patient Spontanous Breathing and Patient connected to supplemental oxygen  Post-op Assessment: Post-op Vital signs reviewed, Patient's Cardiovascular Status Stable, Respiratory Function Stable, Patent Airway and No signs of Nausea or vomiting  Post-op Vital Signs: Reviewed and stable  Complications: No notable events documented.

## 2022-05-15 ENCOUNTER — Encounter: Payer: Self-pay | Admitting: Ophthalmology

## 2022-05-16 ENCOUNTER — Encounter: Payer: Self-pay | Admitting: Ophthalmology

## 2022-05-25 NOTE — Discharge Instructions (Signed)

## 2022-05-28 ENCOUNTER — Ambulatory Visit: Payer: Medicare Other | Admitting: General Practice

## 2022-05-28 ENCOUNTER — Encounter: Payer: Self-pay | Admitting: Ophthalmology

## 2022-05-28 ENCOUNTER — Encounter
Admission: RE | Disposition: A | Discharge status: Home / Self Care | Payer: Self-pay | Source: Home / Self Care | Attending: Ophthalmology

## 2022-05-28 ENCOUNTER — Ambulatory Visit
Admission: RE | Admit: 2022-05-28 | Discharge: 2022-05-28 | Disposition: A | Discharge status: Home / Self Care | Payer: Medicare Other | Attending: Ophthalmology | Admitting: Ophthalmology

## 2022-05-28 ENCOUNTER — Other Ambulatory Visit: Payer: Self-pay

## 2022-05-28 DIAGNOSIS — F17210 Nicotine dependence, cigarettes, uncomplicated: Secondary | ICD-10-CM | POA: Insufficient documentation

## 2022-05-28 DIAGNOSIS — D75839 Thrombocytosis, unspecified: Secondary | ICD-10-CM | POA: Diagnosis not present

## 2022-05-28 DIAGNOSIS — H2512 Age-related nuclear cataract, left eye: Secondary | ICD-10-CM | POA: Insufficient documentation

## 2022-05-28 DIAGNOSIS — D45 Polycythemia vera: Secondary | ICD-10-CM | POA: Insufficient documentation

## 2022-05-28 DIAGNOSIS — M1990 Unspecified osteoarthritis, unspecified site: Secondary | ICD-10-CM | POA: Diagnosis not present

## 2022-05-28 HISTORY — PX: CATARACT EXTRACTION W/PHACO: SHX586

## 2022-05-28 SURGERY — PHACOEMULSIFICATION, CATARACT, WITH IOL INSERTION
Anesthesia: Monitor Anesthesia Care | Site: Eye | Laterality: Left

## 2022-05-28 MED ORDER — MOXIFLOXACIN HCL 0.5 % OP SOLN
OPHTHALMIC | Status: DC | PRN
  Administered 2022-05-28: 0.2 mL via OPHTHALMIC

## 2022-05-28 MED ORDER — FENTANYL CITRATE (PF) 100 MCG/2ML IJ SOLN
INTRAMUSCULAR | Status: DC | PRN
  Administered 2022-05-28 (×2): 50 ug via INTRAVENOUS

## 2022-05-28 MED ORDER — SIGHTPATH DOSE#1 BSS IO SOLN
INTRAOCULAR | Status: DC | PRN
  Administered 2022-05-28: 15 mL

## 2022-05-28 MED ORDER — LACTATED RINGERS IV SOLN: INTRAVENOUS | Status: DC

## 2022-05-28 MED ORDER — SIGHTPATH DOSE#1 BSS IO SOLN
INTRAOCULAR | Status: DC | PRN
  Administered 2022-05-28: 113 mL via OPHTHALMIC

## 2022-05-28 MED ORDER — TETRACAINE HCL 0.5 % OP SOLN
1.0000 [drp] | OPHTHALMIC | Status: DC | PRN
  Administered 2022-05-28 (×3): 1 [drp] via OPHTHALMIC

## 2022-05-28 MED ORDER — PHENYLEPHRINE 80 MCG/ML (10ML) SYRINGE FOR IV PUSH (FOR BLOOD PRESSURE SUPPORT)
PREFILLED_SYRINGE | INTRAVENOUS | Status: AC
  Filled 2022-05-28: qty 10

## 2022-05-28 MED ORDER — MIDAZOLAM HCL 2 MG/2ML IJ SOLN
INTRAMUSCULAR | Status: DC | PRN
  Administered 2022-05-28: 2 mg via INTRAVENOUS

## 2022-05-28 MED ORDER — ARMC OPHTHALMIC DILATING DROPS
1.0000 | OPHTHALMIC | Status: DC | PRN
  Administered 2022-05-28 (×3): 1 via OPHTHALMIC

## 2022-05-28 MED ORDER — SIGHTPATH DOSE#1 NA HYALUR & NA CHOND-NA HYALUR IO KIT
PACK | INTRAOCULAR | Status: DC | PRN
  Administered 2022-05-28: 1 via OPHTHALMIC

## 2022-05-28 MED ORDER — LIDOCAINE HCL (PF) 2 % IJ SOLN
INTRAOCULAR | Status: DC | PRN
  Administered 2022-05-28: 1 mL via INTRAOCULAR

## 2022-05-28 SURGICAL SUPPLY — 13 items
CATARACT SUITE SIGHTPATH (MISCELLANEOUS) ×1 IMPLANT
DISSECTOR HYDRO NUCLEUS 50X22 (MISCELLANEOUS) ×1 IMPLANT
FEE CATARACT SUITE SIGHTPATH (MISCELLANEOUS) ×1 IMPLANT
GLOVE SURG GAMMEX PI TX LF 7.5 (GLOVE) ×1 IMPLANT
GLOVE SURG SYN 8.5  E (GLOVE) ×1
GLOVE SURG SYN 8.5 E (GLOVE) ×1 IMPLANT
GLOVE SURG SYN 8.5 PF PI (GLOVE) ×1 IMPLANT
LENS IOL TECNIS EYHANCE 20.5 (Intraocular Lens) IMPLANT
NDL FILTER BLUNT 18X1 1/2 (NEEDLE) ×1 IMPLANT
NEEDLE FILTER BLUNT 18X1 1/2 (NEEDLE) ×1 IMPLANT
SYR 3ML LL SCALE MARK (SYRINGE) ×1 IMPLANT
SYR 5ML LL (SYRINGE) ×1 IMPLANT
WATER STERILE IRR 250ML POUR (IV SOLUTION) ×1 IMPLANT

## 2022-05-28 NOTE — H&P (Signed)
[facility]   Primary Care Physician:  Mary[place] Pink, MD Ophthalmologist: Dr. Benay Pillow  Pre-Procedure History & Physical: HPI:  Jonathan Evans is a 71 y.o. male here for cataract surgery.   Past Medical History:  Diagnosis Date   Polycythemia vera ([place])    Thrombocytosis    Tobacco abuse     Past Surgical History:  Procedure Laterality Date   CATARACT EXTRACTION W/PHACO Right 05/14/2022   Procedure: CATARACT EXTRACTION PHACO AND INTRAOCULAR LENS PLACEMENT (IOC) RIGHT 12.65 01:10.7;  Surgeon: Eulogio Bear, MD;  Location: [facility];  Service: Ophthalmology;  Laterality: Right;   CLOSURE ENTEROVESICAL FISTULA W/BOWEL &/OR BLADDER RESECTION  2001   KNEE ARTHROSCOPY W/ MENISCAL REPAIR Right    TONSILLECTOMY      Prior to Admission medications   Medication Sig Start Date End Date Taking? Authorizing Provider  aspirin 325 MG tablet Take 325 mg by mouth daily.   Yes [provider]  diclofenac (VOLTAREN) 75 MG EC tablet Take 1 tablet by mouth daily. 04/15/16  Yes [provider]  hydroxyurea (HYDREA) 500 MG capsule TAKE 2 CAPSULES BY MOUTH ONCE  DAILY WITH FOOD 05/14/22  Yes Cammie Sickle, MD  ibuprofen (ADVIL,MOTRIN) 200 MG tablet Take 200 mg by mouth every 6 (six) hours as needed.    [provider]    Allergies as of 04/25/2022   (No Known Allergies)    Family History  Problem Relation Age of Onset   Stomach cancer Father     Social History   Socioeconomic History   Marital status: Married    Spouse name: Not on file   Number of children: Not on file   Years of education: Not on file   Highest education level: Not on file  Occupational History   Not on file  Tobacco Use   Smoking status: Every Day    Packs/day: 1.50    Years: 30.00    Total pack years: 45.00    Types: Cigarettes   Smokeless tobacco: Never   Tobacco comments:    uses e-cig  Substance and Sexual Activity   Alcohol use: No     Alcohol/week: 0.0 standard drinks of alcohol    Comment: rarely   Drug use: No   Sexual activity: Not on file  Other Topics Concern   Not on file  Social History Narrative    Used to repairs trucks; 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  Denies any alcohol. Continues to smoke.    Social Determinants of Health   Financial Resource Strain: Not on file  Food Insecurity: Not on file  Transportation Needs: Not on file  Physical Activity: Not on file  Stress: Not on file  Social Connections: Not on file  Intimate Partner Violence: Not on file    Review of Systems: See HPI, otherwise negative ROS  Physical Exam: BP (!) 143/96   Pulse 73   Temp 98.1 F (36.7 C) (Temporal)   Ht '5\' 8"'$  (1.727 m)   Wt 83.9 kg   SpO2 97%   BMI 28.13 kg/m  General:   Alert, cooperative in NAD Head:  Normocephalic and atraumatic. Respiratory:  Normal work of breathing. Cardiovascular:  RRR  Impression/Plan: Jonathan Evans is here for cataract surgery.  Risks, benefits, limitations, and alternatives regarding cataract surgery have been reviewed with the patient.  Questions have been answered.  All parties agreeable.   Benay Pillow, MD  05/28/2022, 8:01 AM

## 2022-05-28 NOTE — Op Note (Signed)
OPERATIVE NOTE  JODIE LEINER [id number] 05/28/2022   PREOPERATIVE DIAGNOSIS:  Nuclear sclerotic cataract left eye.  H25.12   POSTOPERATIVE DIAGNOSIS:    Nuclear sclerotic cataract left eye.     PROCEDURE:  Phacoemusification with posterior chamber intraocular lens placement of the left eye   LENS:   Implant Name Type Inv. Item Serial No. Manufacturer Lot No. LRB No. Used Action  LENS IOL TECNIS EYHANCE 20.5 - [id number] Intraocular Lens LENS IOL TECNIS EYHANCE 20.5 [id number] [employer]  Left 1 Implanted      Procedure(s) with comments: CATARACT EXTRACTION PHACO AND INTRAOCULAR LENS PLACEMENT (IOC) LEFT (Left) - 7.35 00:50.3  DIB00 +20.5   ULTRASOUND TIME: 0 minutes 50 seconds.  CDE 7.35   SURGEON:  Benay Pillow, MD, MPH   ANESTHESIA:  Topical with tetracaine drops augmented with 1% preservative-free intracameral lidocaine.  ESTIMATED BLOOD LOSS: <1 mL   COMPLICATIONS:  None.   DESCRIPTION OF PROCEDURE:  The patient was identified in the holding room and transported to the operating room and placed in the supine position under the operating microscope.  The left eye was identified as the operative eye and it was prepped and draped in the usual sterile ophthalmic fashion.   A 1.0 millimeter clear-corneal paracentesis was made at the 5:00 position. 0.5 ml of preservative-free 1% lidocaine with epinephrine was injected into the anterior chamber.  The anterior chamber was filled with viscoelastic.  A 2.4 millimeter keratome was used to make a near-clear corneal incision at the 2:00 position.  A curvilinear capsulorrhexis was made with a cystotome and capsulorrhexis forceps.  Balanced salt solution was used to hydrodissect and hydrodelineate the nucleus.   Phacoemulsification was then used in stop and chop fashion to remove the lens nucleus and epinucleus.  The remaining cortex was then removed using the irrigation and aspiration handpiece. Viscoelastic was then placed into the  capsular bag to distend it for lens placement.  A lens was then injected into the capsular bag.  The remaining viscoelastic was aspirated.   Wounds were hydrated with balanced salt solution.  The anterior chamber was inflated to a physiologic pressure with balanced salt solution.  Intracameral vigamox 0.1 mL undiltued was injected into the eye and a drop placed onto the ocular surface.  No wound leaks were noted.  The patient was taken to the recovery room in stable condition without complications of anesthesia or surgery  Benay Pillow 05/28/2022, 8:33 AM

## 2022-05-28 NOTE — Anesthesia Preprocedure Evaluation (Signed)
Anesthesia Evaluation  Patient identified by MRN, date of birth, ID band Patient awake    Reviewed: Allergy & Precautions, NPO status , Patient's Chart, lab work & pertinent test results  History of Anesthesia Complications Negative for: history of anesthetic complications  Airway Mallampati: II  TM Distance: >3 FB Neck ROM: full    Dental no notable dental hx.    Pulmonary Current Smoker and Patient abstained from smoking.,    Pulmonary exam normal        Cardiovascular Exercise Tolerance: Good negative cardio ROS Normal cardiovascular exam     Neuro/Psych negative neurological ROS  negative psych ROS   GI/Hepatic negative GI ROS, Neg liver ROS,   Endo/Other  negative endocrine ROS  Renal/GU negative Renal ROS     Musculoskeletal  (+) Arthritis ,   Abdominal Normal abdominal exam  (+)   Peds  Hematology negative hematology ROS (+) H/o PV and thrombocytosis   Anesthesia Other Findings Past Medical History: No date: Polycythemia vera ([place]) No date: Thrombocytosis No date: Tobacco abuse  Past Surgical History: 2001: CLOSURE ENTEROVESICAL FISTULA W/BOWEL &/OR BLADDER RESECTION No date: KNEE ARTHROSCOPY W/ MENISCAL REPAIR; Right No date: TONSILLECTOMY  BMI    Body Mass Index: 28.13 kg/m      Reproductive/Obstetrics negative OB ROS                             Anesthesia Physical  Anesthesia Plan  ASA: 2  Anesthesia Plan: MAC   Post-op Pain Management: Minimal or no pain anticipated   Induction: Intravenous  PONV Risk Score and Plan:   Airway Management Planned: Natural Airway and Nasal Cannula  Additional Equipment:   Intra-op Plan:   Post-operative Plan:   Informed Consent: I have reviewed the patients History and Physical, chart, labs and discussed the procedure including the risks, benefits and alternatives for the proposed anesthesia with the patient or  authorized representative who has indicated his/her understanding and acceptance.     Dental Advisory Given  Plan Discussed with: Anesthesiologist, CRNA and Surgeon  Anesthesia Plan Comments: (Patient consented for risks of anesthesia including but not limited to:  - adverse reactions to medications - damage to eyes, teeth, lips or other oral mucosa - nerve damage due to positioning  - sore throat or hoarseness - Damage to heart, brain, nerves, lungs, other parts of body or loss of life  Patient voiced understanding.)        Anesthesia Quick Evaluation

## 2022-05-28 NOTE — Anesthesia Postprocedure Evaluation (Signed)
Anesthesia Post Note  Patient: Jonathan Evans  Procedure(s) Performed: CATARACT EXTRACTION PHACO AND INTRAOCULAR LENS PLACEMENT (IOC) LEFT (Left: Eye)  Patient location during evaluation: PACU Anesthesia Type: MAC Level of consciousness: awake and alert Pain management: pain level controlled Vital Signs Assessment: post-procedure vital signs reviewed and stable Respiratory status: spontaneous breathing, nonlabored ventilation, respiratory function stable and patient connected to nasal cannula oxygen Cardiovascular status: stable and blood pressure returned to baseline Postop Assessment: no apparent nausea or vomiting Anesthetic complications: no   No notable events documented.   Last Vitals:  Vitals:   05/28/22 0704 05/28/22 0835  BP: (!) 143/96 132/79  Pulse: 73 64  Resp:  16  Temp: 36.7 C (!) 36.1 C  SpO2: 97% 97%    Last Pain:  Vitals:   05/28/22 0835  TempSrc:   PainSc: 0-No pain                 Ilene Qua

## 2022-05-28 NOTE — Transfer of Care (Signed)
Immediate Anesthesia Transfer of Care Note  Patient: Jonathan Evans  Procedure(s) Performed: CATARACT EXTRACTION PHACO AND INTRAOCULAR LENS PLACEMENT (IOC) LEFT (Left: Eye)  Patient Location: PACU  Anesthesia Type: MAC  Level of Consciousness: awake, alert  and patient cooperative  Airway and Oxygen Therapy: Patient Spontanous Breathing and Patient connected to supplemental oxygen  Post-op Assessment: Post-op Vital signs reviewed, Patient's Cardiovascular Status Stable, Respiratory Function Stable, Patent Airway and No signs of Nausea or vomiting  Post-op Vital Signs: Reviewed and stable  Complications: No notable events documented.

## 2022-05-29 ENCOUNTER — Encounter: Payer: Self-pay | Admitting: Ophthalmology

## 2022-06-04 ENCOUNTER — Encounter: Payer: Self-pay | Admitting: Internal Medicine

## 2022-07-03 ENCOUNTER — Emergency Department: Payer: Medicare Other | Admitting: Anesthesiology

## 2022-07-03 ENCOUNTER — Inpatient Hospital Stay: Payer: Medicare Other

## 2022-07-03 ENCOUNTER — Encounter: Payer: Self-pay | Admitting: Emergency Medicine

## 2022-07-03 ENCOUNTER — Encounter: Admission: EM | Disposition: E | Payer: Self-pay | Source: Home / Self Care | Attending: Internal Medicine

## 2022-07-03 ENCOUNTER — Inpatient Hospital Stay
Admission: EM | Admit: 2022-07-03 | Discharge: 2022-07-20 | DRG: 269 | Disposition: E | Payer: Medicare Other | Attending: Internal Medicine | Admitting: Internal Medicine

## 2022-07-03 ENCOUNTER — Other Ambulatory Visit: Payer: Self-pay

## 2022-07-03 ENCOUNTER — Emergency Department: Payer: Medicare Other

## 2022-07-03 DIAGNOSIS — Z515 Encounter for palliative care: Secondary | ICD-10-CM

## 2022-07-03 DIAGNOSIS — D45 Polycythemia vera: Secondary | ICD-10-CM | POA: Diagnosis present

## 2022-07-03 DIAGNOSIS — Z7982 Long term (current) use of aspirin: Secondary | ICD-10-CM | POA: Diagnosis not present

## 2022-07-03 DIAGNOSIS — Z79899 Other long term (current) drug therapy: Secondary | ICD-10-CM | POA: Diagnosis not present

## 2022-07-03 DIAGNOSIS — Z1152 Encounter for screening for COVID-19: Secondary | ICD-10-CM

## 2022-07-03 DIAGNOSIS — D62 Acute posthemorrhagic anemia: Secondary | ICD-10-CM | POA: Diagnosis present

## 2022-07-03 DIAGNOSIS — I7132 Juxtarenal abdominal aortic aneurysm, ruptured: Secondary | ICD-10-CM

## 2022-07-03 DIAGNOSIS — I715 Thoracoabdominal aortic aneurysm, ruptured, unspecified: Principal | ICD-10-CM

## 2022-07-03 DIAGNOSIS — F17210 Nicotine dependence, cigarettes, uncomplicated: Secondary | ICD-10-CM | POA: Diagnosis present

## 2022-07-03 DIAGNOSIS — Z66 Do not resuscitate: Secondary | ICD-10-CM | POA: Diagnosis not present

## 2022-07-03 DIAGNOSIS — Z800 Family history of malignant neoplasm of digestive organs: Secondary | ICD-10-CM | POA: Diagnosis not present

## 2022-07-03 DIAGNOSIS — N189 Chronic kidney disease, unspecified: Secondary | ICD-10-CM | POA: Diagnosis present

## 2022-07-03 DIAGNOSIS — N184 Chronic kidney disease, stage 4 (severe): Secondary | ICD-10-CM | POA: Diagnosis present

## 2022-07-03 DIAGNOSIS — I713 Abdominal aortic aneurysm, ruptured, unspecified: Secondary | ICD-10-CM | POA: Diagnosis not present

## 2022-07-03 DIAGNOSIS — R578 Other shock: Secondary | ICD-10-CM | POA: Diagnosis present

## 2022-07-03 DIAGNOSIS — I129 Hypertensive chronic kidney disease with stage 1 through stage 4 chronic kidney disease, or unspecified chronic kidney disease: Secondary | ICD-10-CM | POA: Diagnosis present

## 2022-07-03 DIAGNOSIS — R739 Hyperglycemia, unspecified: Secondary | ICD-10-CM | POA: Diagnosis present

## 2022-07-03 DIAGNOSIS — R55 Syncope and collapse: Secondary | ICD-10-CM | POA: Diagnosis present

## 2022-07-03 DIAGNOSIS — I120 Hypertensive chronic kidney disease with stage 5 chronic kidney disease or end stage renal disease: Secondary | ICD-10-CM | POA: Diagnosis not present

## 2022-07-03 DIAGNOSIS — N1831 Chronic kidney disease, stage 3a: Secondary | ICD-10-CM

## 2022-07-03 DIAGNOSIS — N179 Acute kidney failure, unspecified: Secondary | ICD-10-CM | POA: Diagnosis present

## 2022-07-03 DIAGNOSIS — Z8249 Family history of ischemic heart disease and other diseases of the circulatory system: Secondary | ICD-10-CM | POA: Diagnosis not present

## 2022-07-03 DIAGNOSIS — N185 Chronic kidney disease, stage 5: Secondary | ICD-10-CM

## 2022-07-03 DIAGNOSIS — R579 Shock, unspecified: Secondary | ICD-10-CM

## 2022-07-03 HISTORY — PX: ENDOVASCULAR REPAIR/STENT GRAFT: CATH118280

## 2022-07-03 LAB — CBC WITH DIFFERENTIAL/PLATELET
Abs Immature Granulocytes: 0.09 10*3/uL — ABNORMAL HIGH (ref 0.00–0.07)
Basophils Absolute: 0 10*3/uL (ref 0.0–0.1)
Basophils Relative: 0 %
Eosinophils Absolute: 0 10*3/uL (ref 0.0–0.5)
Eosinophils Relative: 0 %
HCT: 23.8 % — ABNORMAL LOW (ref 39.0–52.0)
Hemoglobin: 8 g/dL — ABNORMAL LOW (ref 13.0–17.0)
Immature Granulocytes: 1 %
Lymphocytes Relative: 7 %
Lymphs Abs: 0.5 10*3/uL — ABNORMAL LOW (ref 0.7–4.0)
MCH: 32.9 pg (ref 26.0–34.0)
MCHC: 33.6 g/dL (ref 30.0–36.0)
MCV: 97.9 fL (ref 80.0–100.0)
Monocytes Absolute: 0.9 10*3/uL (ref 0.1–1.0)
Monocytes Relative: 11 %
Neutro Abs: 6.5 10*3/uL (ref 1.7–7.7)
Neutrophils Relative %: 81 %
Platelets: 72 10*3/uL — ABNORMAL LOW (ref 150–400)
RBC: 2.43 MIL/uL — ABNORMAL LOW (ref 4.22–5.81)
RDW: 24.6 % — ABNORMAL HIGH (ref 11.5–15.5)
Smear Review: NORMAL
WBC: 8 10*3/uL (ref 4.0–10.5)
nRBC: 0.6 % — ABNORMAL HIGH (ref 0.0–0.2)

## 2022-07-03 LAB — LIPID PANEL
Cholesterol: 76 mg/dL (ref 0–200)
HDL: 14 mg/dL — ABNORMAL LOW (ref >40)
LDL Cholesterol: 36 mg/dL (ref 0–99)
Total CHOL/HDL Ratio: 5.4 RATIO
Triglycerides: 132 mg/dL (ref <150)
VLDL: 26 mg/dL (ref 0–40)

## 2022-07-03 LAB — BASIC METABOLIC PANEL
Anion gap: 13 (ref 5–15)
BUN: 54 mg/dL — ABNORMAL HIGH (ref 8–23)
CO2: 20 mmol/L — ABNORMAL LOW (ref 22–32)
Calcium: 8.6 mg/dL — ABNORMAL LOW (ref 8.9–10.3)
Chloride: 100 mmol/L (ref 98–111)
Creatinine, Ser: 3.51 mg/dL — ABNORMAL HIGH (ref 0.61–1.24)
GFR, Estimated: 18 mL/min — ABNORMAL LOW (ref >60)
Glucose, Bld: 168 mg/dL — ABNORMAL HIGH (ref 70–99)
Potassium: 4.8 mmol/L (ref 3.5–5.1)
Sodium: 133 mmol/L — ABNORMAL LOW (ref 135–145)

## 2022-07-03 LAB — CBC
HCT: 24.5 % — ABNORMAL LOW (ref 39.0–52.0)
Hemoglobin: 8.6 g/dL — ABNORMAL LOW (ref 13.0–17.0)
MCH: 44.1 pg — ABNORMAL HIGH (ref 26.0–34.0)
MCHC: 35.1 g/dL (ref 30.0–36.0)
MCV: 125.6 fL — ABNORMAL HIGH (ref 80.0–100.0)
Platelets: 159 10*3/uL (ref 150–400)
RBC: 1.95 MIL/uL — ABNORMAL LOW (ref 4.22–5.81)
RDW: 13.9 % (ref 11.5–15.5)
WBC: 12.3 10*3/uL — ABNORMAL HIGH (ref 4.0–10.5)
nRBC: 0.2 % (ref 0.0–0.2)

## 2022-07-03 LAB — MRSA NEXT GEN BY PCR, NASAL: MRSA by PCR Next Gen: NOT DETECTED

## 2022-07-03 LAB — PREPARE RBC (CROSSMATCH)

## 2022-07-03 LAB — ABO/RH: ABO/RH(D): B POS

## 2022-07-03 LAB — MAGNESIUM: Magnesium: 2 mg/dL (ref 1.7–2.4)

## 2022-07-03 LAB — GLUCOSE, CAPILLARY: Glucose-Capillary: 159 mg/dL — ABNORMAL HIGH (ref 70–99)

## 2022-07-03 LAB — PROTIME-INR
INR: 1.6 — ABNORMAL HIGH (ref 0.8–1.2)
Prothrombin Time: 19 seconds — ABNORMAL HIGH (ref 11.4–15.2)

## 2022-07-03 LAB — PHOSPHORUS: Phosphorus: 7.1 mg/dL — ABNORMAL HIGH (ref 2.5–4.6)

## 2022-07-03 LAB — TROPONIN I (HIGH SENSITIVITY): Troponin I (High Sensitivity): 45 ng/L — ABNORMAL HIGH (ref <18)

## 2022-07-03 LAB — SARS CORONAVIRUS 2 BY RT PCR: SARS Coronavirus 2 by RT PCR: NEGATIVE

## 2022-07-03 SURGERY — INSERTION, ENDOVASCULAR STENT GRAFT, AORTA, ABDOMINAL
Anesthesia: General

## 2022-07-03 SURGERY — ENDOVASCULAR STENT GRAFT (AAA)
Anesthesia: General | Laterality: Bilateral

## 2022-07-03 MED ORDER — LACTATED RINGERS IV BOLUS
1000.0000 mL | Freq: Once | INTRAVENOUS | Status: AC
  Administered 2022-07-03: 1000 mL via INTRAVENOUS

## 2022-07-03 MED ORDER — CEFAZOLIN SODIUM-DEXTROSE 2-4 GM/100ML-% IV SOLN
2.0000 g | INTRAVENOUS | Status: AC
  Administered 2022-07-03: 2 g via INTRAVENOUS
  Filled 2022-07-03: qty 100

## 2022-07-03 MED ORDER — SEVOFLURANE IN SOLN
RESPIRATORY_TRACT | Status: AC
  Filled 2022-07-03: qty 250

## 2022-07-03 MED ORDER — SODIUM CHLORIDE 0.9 % IV BOLUS
1000.0000 mL | Freq: Once | INTRAVENOUS | Status: AC
  Administered 2022-07-03: 1000 mL via INTRAVENOUS

## 2022-07-03 MED ORDER — SODIUM BICARBONATE 8.4 % IV SOLN
INTRAVENOUS | Status: AC
  Filled 2022-07-03: qty 100

## 2022-07-03 MED ORDER — MIDAZOLAM HCL 2 MG/2ML IJ SOLN
INTRAMUSCULAR | Status: DC | PRN
  Administered 2022-07-03 (×4): 1 mg via INTRAVENOUS

## 2022-07-03 MED ORDER — DOCUSATE SODIUM 100 MG PO CAPS: 100.0000 mg | ORAL_CAPSULE | Freq: Two times a day (BID) | ORAL | Status: DC | PRN

## 2022-07-03 MED ORDER — CHLORHEXIDINE GLUCONATE CLOTH 2 % EX PADS
6.0000 | MEDICATED_PAD | Freq: Every day | CUTANEOUS | Status: DC
  Administered 2022-07-03: 6 via TOPICAL

## 2022-07-03 MED ORDER — SODIUM CHLORIDE 0.9 % IV SOLN: INTRAVENOUS | Status: DC | PRN

## 2022-07-03 MED ORDER — FENTANYL CITRATE (PF) 100 MCG/2ML IJ SOLN
INTRAMUSCULAR | Status: AC
  Filled 2022-07-03: qty 2

## 2022-07-03 MED ORDER — PROPOFOL 1000 MG/100ML IV EMUL: 0.0000 ug/kg/min | INTRAVENOUS | Status: DC

## 2022-07-03 MED ORDER — PHENYLEPHRINE HCL (PRESSORS) 10 MG/ML IV SOLN
INTRAVENOUS | Status: AC
  Filled 2022-07-03: qty 1

## 2022-07-03 MED ORDER — METOPROLOL TARTRATE 5 MG/5ML IV SOLN
INTRAVENOUS | Status: AC
  Filled 2022-07-03: qty 5

## 2022-07-03 MED ORDER — POLYETHYLENE GLYCOL 3350 17 G PO PACK: 17.0000 g | PACK | Freq: Every day | ORAL | Status: DC | PRN

## 2022-07-03 MED ORDER — DOCUSATE SODIUM 50 MG/5ML PO LIQD: 100.0000 mg | Freq: Two times a day (BID) | ORAL | Status: DC

## 2022-07-03 MED ORDER — PROPOFOL 10 MG/ML IV BOLUS
INTRAVENOUS | Status: AC
  Filled 2022-07-03: qty 20

## 2022-07-03 MED ORDER — MIDAZOLAM HCL 2 MG/2ML IJ SOLN
INTRAMUSCULAR | Status: AC
  Filled 2022-07-03: qty 2

## 2022-07-03 MED ORDER — FENTANYL CITRATE PF 50 MCG/ML IJ SOSY: 25.0000 ug | PREFILLED_SYRINGE | Freq: Once | INTRAMUSCULAR | Status: DC

## 2022-07-03 MED ORDER — POLYETHYLENE GLYCOL 3350 17 G PO PACK: 17.0000 g | PACK | Freq: Every day | ORAL | Status: DC

## 2022-07-03 MED ORDER — FAMOTIDINE IN NACL 20-0.9 MG/50ML-% IV SOLN: 20.0000 mg | Freq: Two times a day (BID) | INTRAVENOUS | Status: DC

## 2022-07-03 MED ORDER — NOREPINEPHRINE 4 MG/250ML-% IV SOLN
2.0000 ug/min | INTRAVENOUS | Status: DC
  Administered 2022-07-03: 10 ug/min via INTRAVENOUS
  Filled 2022-07-03: qty 250

## 2022-07-03 MED ORDER — HEPARIN SODIUM (PORCINE) 1000 UNIT/ML IJ SOLN
INTRAMUSCULAR | Status: DC | PRN
  Administered 2022-07-03: 4000 [IU] via INTRAVENOUS

## 2022-07-03 MED ORDER — PHENYLEPHRINE HCL-NACL 20-0.9 MG/250ML-% IV SOLN
INTRAVENOUS | Status: AC
  Filled 2022-07-03: qty 250

## 2022-07-03 MED ORDER — FENTANYL BOLUS VIA INFUSION: 25.0000 ug | INTRAVENOUS | Status: DC | PRN

## 2022-07-03 MED ORDER — SODIUM BICARBONATE 8.4 % IV SOLN
100.0000 meq | Freq: Once | INTRAVENOUS | Status: AC
  Administered 2022-07-03: 100 meq via INTRAVENOUS

## 2022-07-03 MED ORDER — SUCCINYLCHOLINE CHLORIDE 200 MG/10ML IV SOSY
PREFILLED_SYRINGE | INTRAVENOUS | Status: DC | PRN
  Administered 2022-07-03: 120 mg via INTRAVENOUS

## 2022-07-03 MED ORDER — ROCURONIUM BROMIDE 100 MG/10ML IV SOLN
INTRAVENOUS | Status: DC | PRN
  Administered 2022-07-03: 30 mg via INTRAVENOUS
  Administered 2022-07-03: 40 mg via INTRAVENOUS

## 2022-07-03 MED ORDER — KETAMINE HCL 10 MG/ML IJ SOLN
INTRAMUSCULAR | Status: DC | PRN
  Administered 2022-07-03: 10 mg via INTRAVENOUS

## 2022-07-03 MED ORDER — PHENYLEPHRINE 80 MCG/ML (10ML) SYRINGE FOR IV PUSH (FOR BLOOD PRESSURE SUPPORT)
PREFILLED_SYRINGE | INTRAVENOUS | Status: AC
  Filled 2022-07-03: qty 10

## 2022-07-03 MED ORDER — SODIUM CHLORIDE 0.9 % IV SOLN: INTRAVENOUS | Status: DC

## 2022-07-03 MED ORDER — PHENYLEPHRINE CONCENTRATED 100MG/250ML (0.4 MG/ML) INFUSION SIMPLE
0.0000 ug/min | INTRAVENOUS | Status: DC
  Administered 2022-07-03: 20 ug/min via INTRAVENOUS
  Filled 2022-07-03: qty 250

## 2022-07-03 MED ORDER — PROPOFOL 10 MG/ML IV BOLUS
INTRAVENOUS | Status: DC | PRN
  Administered 2022-07-03: 30 mg via INTRAVENOUS

## 2022-07-03 MED ORDER — KETAMINE HCL 10 MG/ML IJ SOLN
INTRAMUSCULAR | Status: DC | PRN
  Administered 2022-07-03: 40 mg via INTRAMUSCULAR
  Administered 2022-07-03: 10 mg via INTRAMUSCULAR

## 2022-07-03 MED ORDER — ALBUTEROL SULFATE HFA 108 (90 BASE) MCG/ACT IN AERS
INHALATION_SPRAY | RESPIRATORY_TRACT | Status: DC | PRN
  Administered 2022-07-03: 8 via RESPIRATORY_TRACT

## 2022-07-03 MED ORDER — KETAMINE HCL 50 MG/5ML IJ SOSY
PREFILLED_SYRINGE | INTRAMUSCULAR | Status: AC
  Filled 2022-07-03: qty 5

## 2022-07-03 MED ORDER — INSULIN ASPART 100 UNIT/ML IJ SOLN: 0.0000 [IU] | INTRAMUSCULAR | Status: DC

## 2022-07-03 MED ORDER — ETOMIDATE 2 MG/ML IV SOLN
INTRAVENOUS | Status: AC
  Filled 2022-07-03: qty 10

## 2022-07-03 MED ORDER — NOREPINEPHRINE 16 MG/250ML-% IV SOLN
2.0000 ug/min | INTRAVENOUS | Status: DC
  Administered 2022-07-03: 40 ug/min via INTRAVENOUS
  Filled 2022-07-03: qty 250

## 2022-07-03 MED ORDER — FENTANYL 2500MCG IN NS 250ML (10MCG/ML) PREMIX INFUSION
25.0000 ug/h | INTRAVENOUS | Status: DC
  Administered 2022-07-03: 50 ug/h via INTRAVENOUS
  Filled 2022-07-03: qty 250

## 2022-07-03 MED ORDER — VASOPRESSIN 20 UNITS/100 ML INFUSION FOR SHOCK
0.0000 [IU]/min | INTRAVENOUS | Status: DC
  Administered 2022-07-03: 0.03 [IU]/min via INTRAVENOUS
  Filled 2022-07-03: qty 100

## 2022-07-03 MED ORDER — FENTANYL CITRATE (PF) 100 MCG/2ML IJ SOLN
INTRAMUSCULAR | Status: DC | PRN
  Administered 2022-07-03 (×4): 50 ug via INTRAVENOUS

## 2022-07-03 MED ORDER — PHENYLEPHRINE HCL (PRESSORS) 10 MG/ML IV SOLN
INTRAVENOUS | Status: DC | PRN
  Administered 2022-07-03 (×3): 80 ug via INTRAVENOUS

## 2022-07-03 SURGICAL SUPPLY — 56 items
APPLIER CLIP 11 MED OPEN (CLIP)
APPLIER CLIP 13 LRG OPEN (CLIP)
APPLIER CLIP 9.375 SM OPEN (CLIP)
BAG DECANTER FOR FLEXI CONT (MISCELLANEOUS) ×1 IMPLANT
BLADE SURG 15 STRL LF DISP TIS (BLADE) ×1 IMPLANT
BLADE SURG 15 STRL SS (BLADE) ×1
BLADE SURG SZ11 CARB STEEL (BLADE) ×1 IMPLANT
BOOT SUTURE AID YELLOW STND (SUTURE) ×1 IMPLANT
BRUSH SCRUB EZ  4% CHG (MISCELLANEOUS) ×1
BRUSH SCRUB EZ 4% CHG (MISCELLANEOUS) ×1 IMPLANT
CLIP APPLIE 11 MED OPEN (CLIP) IMPLANT
CLIP APPLIE 13 LRG OPEN (CLIP) IMPLANT
CLIP APPLIE 9.375 SM OPEN (CLIP) IMPLANT
DERMABOND ADVANCED .7 DNX12 (GAUZE/BANDAGES/DRESSINGS) ×1 IMPLANT
ELECT CAUTERY BLADE 6.4 (BLADE) ×1 IMPLANT
ELECT REM PT RETURN 9FT ADLT (ELECTROSURGICAL) ×1
ELECTRODE REM PT RTRN 9FT ADLT (ELECTROSURGICAL) ×1 IMPLANT
GAUZE 4X4 16PLY ~~LOC~~+RFID DBL (SPONGE) ×1 IMPLANT
GLOVE BIO SURGEON STRL SZ7 (GLOVE) ×3 IMPLANT
GLOVE SURG SYN 8.0 (GLOVE) ×1 IMPLANT
GOWN STRL REUS W/ TWL LRG LVL3 (GOWN DISPOSABLE) ×3 IMPLANT
GOWN STRL REUS W/ TWL XL LVL3 (GOWN DISPOSABLE) ×2 IMPLANT
GOWN STRL REUS W/TWL LRG LVL3 (GOWN DISPOSABLE) ×3
GOWN STRL REUS W/TWL XL LVL3 (GOWN DISPOSABLE) ×2
HEMOSTAT SURGICEL 2X3 (HEMOSTASIS) IMPLANT
IV NS 500ML (IV SOLUTION) ×1
IV NS 500ML BAXH (IV SOLUTION) ×1 IMPLANT
LABEL OR SOLS (LABEL) ×1 IMPLANT
LOOP VESSEL MAXI  1X406 RED (MISCELLANEOUS) ×1
LOOP VESSEL MAXI 1X406 RED (MISCELLANEOUS) ×1 IMPLANT
LOOP VESSEL MINI 0.8X406 BLUE (MISCELLANEOUS) ×1 IMPLANT
MANIFOLD NEPTUNE II (INSTRUMENTS) ×1 IMPLANT
NDL SAFETY ECLIP 18X1.5 (MISCELLANEOUS) IMPLANT
NEEDLE HYPO 25X1 1.5 SAFETY (NEEDLE) IMPLANT
PACK BASIN MAJOR ARMC (MISCELLANEOUS) ×1 IMPLANT
SPONGE T-LAP 18X18 ~~LOC~~+RFID (SPONGE) ×2 IMPLANT
SUT MNCRL 4-0 (SUTURE) ×1
SUT MNCRL 4-0 27XMFL (SUTURE) ×1
SUT PROLENE 5 0 RB 1 DA (SUTURE) IMPLANT
SUT PROLENE 6 0 BV (SUTURE) IMPLANT
SUT SILK 2 0 (SUTURE)
SUT SILK 2-0 18XBRD TIE 12 (SUTURE) IMPLANT
SUT SILK 3 0 (SUTURE)
SUT SILK 3-0 18XBRD TIE 12 (SUTURE) IMPLANT
SUT SILK 4 0 (SUTURE)
SUT SILK 4-0 18XBRD TIE 12 (SUTURE) IMPLANT
SUT VIC AB 2-0 CT1 (SUTURE) IMPLANT
SUT VICRYL+ 3-0 36IN CT-1 (SUTURE) IMPLANT
SUTURE MNCRL 4-0 27XMF (SUTURE) ×1 IMPLANT
SYR 10ML LL (SYRINGE) IMPLANT
SYR 20ML LL LF (SYRINGE) ×1 IMPLANT
SYR 3ML LL SCALE MARK (SYRINGE) IMPLANT
SYR BULB IRRIG 60ML STRL (SYRINGE) IMPLANT
TOWEL OR 17X26 4PK STRL BLUE (TOWEL DISPOSABLE) IMPLANT
TRAP FLUID SMOKE EVACUATOR (MISCELLANEOUS) ×1 IMPLANT
WATER STERILE IRR 500ML POUR (IV SOLUTION) ×1 IMPLANT

## 2022-07-03 SURGICAL SUPPLY — 27 items
CATH ACCU-VU SIZ PIG 5F 70CM (CATHETERS) IMPLANT
CATH ANGIO 5F PIGTAIL 65CM (CATHETERS) IMPLANT
CATH BALLN CODA 9X100X32 (BALLOONS) IMPLANT
CATH KUMPE SOFT-VU 5FR 65 (CATHETERS) IMPLANT
CLOSURE PERCLOSE PROSTYLE (VASCULAR PRODUCTS) IMPLANT
COVER DRAPE FLUORO 36X44 (DRAPES) IMPLANT
COVER PROBE ULTRASOUND 5X96 (MISCELLANEOUS) IMPLANT
DEVICE SAFEGUARD 24CM (GAUZE/BANDAGES/DRESSINGS) IMPLANT
DRYSEAL FLEXSHEATH 12FR 33CM (SHEATH) ×1
DRYSEAL FLEXSHEATH 18FR 33CM (SHEATH) ×1
EXCLUDER TNK LEG 28MX12X12 (Endovascular Graft) IMPLANT
EXCLUDER TRUNK LEG 28MX12X12 (Endovascular Graft) ×1 IMPLANT
GLIDEWIRE STIFF .35X180X3 HYDR (WIRE) IMPLANT
LEG CONTRALATERAL 16X14.5X14 (Vascular Products) IMPLANT
LEG CONTRALETERAL16X16X11.5 (Endovascular Graft) ×1 IMPLANT
PACK ANGIOGRAPHY (CUSTOM PROCEDURE TRAY) ×1 IMPLANT
SHEATH BRITE TIP 6FRX11 (SHEATH) IMPLANT
SHEATH BRITE TIP 8FRX11 (SHEATH) IMPLANT
SHEATH DRYSEAL FLEX 12FR 33CM (SHEATH) IMPLANT
SHEATH DRYSEAL FLEX 18FR 33CM (SHEATH) IMPLANT
SPONGE XRAY 4X4 16PLY STRL (MISCELLANEOUS) IMPLANT
STENT GRAFT CONTRALAT 16X11.5 (Endovascular Graft) IMPLANT
SUT MNCRL AB 4-0 PS2 18 (SUTURE) IMPLANT
SYR MEDRAD MARK 7 150ML (SYRINGE) IMPLANT
TUBING CONTRAST HIGH PRESS 72 (TUBING) IMPLANT
WIRE AMPLATZ SSTIFF .035X260CM (WIRE) IMPLANT
WIRE GUIDERIGHT .035X150 (WIRE) IMPLANT

## 2022-07-03 NOTE — ED Notes (Signed)
Initiate Massive Transfusion Protocol MTP per Jari Pigg MD

## 2022-07-03 NOTE — ED Notes (Signed)
3rd Unit of Blood complete

## 2022-07-03 NOTE — Anesthesia Procedure Notes (Signed)
Procedure Name: Intubation Date/Time: 06/30/2022 8:20 PM  Performed by: Molli Barrows, MDPre-anesthesia Checklist: Patient identified, Patient being monitored, Timeout performed, Emergency Drugs available and Suction available Patient Re-evaluated:Patient Re-evaluated prior to induction Oxygen Delivery Method: Circle system utilized Preoxygenation: Pre-oxygenation with 100% oxygen Induction Type: IV induction, Rapid sequence and Cricoid Pressure applied Ventilation: Mask ventilation without difficulty Laryngoscope Size: Mac and 3 Grade View: Grade II Tube type: Oral Tube size: 7.5 mm Number of attempts: 1 Airway Equipment and Method: Stylet Placement Confirmation: ETT inserted through vocal cords under direct vision, positive ETCO2 and breath sounds checked- equal and bilateral Secured at: 23 cm Tube secured with: Tape Dental Injury: Teeth and Oropharynx as per pre-operative assessment

## 2022-07-03 NOTE — Consult Note (Signed)
[facility] Admission History & Physical  MRN : [id number]  Jonathan Evans is a 71 y.o. (1951-04-26) male who presents with chief complaint of  Chief Complaint  Patient presents with   Flank Pain  .  History of Present Illness: I am asked to see the patient by Dr. Jari Pigg for a ruptured abdominal aortic aneurysm.  The patient's been having severe abdominal and back pain for 1 to 2 days.  He had no previous knowledge of an aneurysm.  He came in and got an uninfused CT looking for stones and he was found to have a greater than 10 cm ruptured abdominal aortic aneurysm.  He was hypotensive.  He has gotten 2 units of blood and has fluid wide open and his blood pressure is currently about 110/50.  He remains awake and alert.  He has severe pain.  He has severe chronic kidney disease.  I have independently reviewed his CT scan and he clearly has a ruptured abdominal aortic aneurysm of greater than 10 cm and it is in a juxtarenal location.  We appear to have 1-1/2 cm of neck potentially to the base of the SMA.  No current facility-administered medications for this encounter.   Current Outpatient Medications  Medication Sig Dispense Refill   aspirin 325 MG tablet Take 325 mg by mouth daily.     diclofenac (VOLTAREN) 75 MG EC tablet Take 1 tablet by mouth daily.  1   hydroxyurea (HYDREA) 500 MG capsule TAKE 2 CAPSULES BY MOUTH ONCE  DAILY WITH FOOD 160 capsule 3   ibuprofen (ADVIL,MOTRIN) 200 MG tablet Take 200 mg by mouth every 6 (six) hours as needed.      Past Medical History:  Diagnosis Date   Polycythemia vera ([place])    Thrombocytosis    Tobacco abuse     Past Surgical History:  Procedure Laterality Date   CATARACT EXTRACTION W/PHACO Right 05/14/2022   Procedure: CATARACT EXTRACTION PHACO AND INTRAOCULAR LENS PLACEMENT (IOC) RIGHT 12.65 01:10.7;  Surgeon: Eulogio Bear, MD;  Location: [place];  Service: Ophthalmology;  Laterality: Right;   CATARACT  EXTRACTION W/PHACO Left 05/28/2022   Procedure: CATARACT EXTRACTION PHACO AND INTRAOCULAR LENS PLACEMENT ([place]) LEFT;  Surgeon: Eulogio Bear, MD;  Location: [place];  Service: Ophthalmology;  Laterality: Left;  7.35 00:50.3   CLOSURE ENTEROVESICAL FISTULA W/BOWEL &/OR BLADDER RESECTION  2001   KNEE ARTHROSCOPY W/ MENISCAL REPAIR Right    TONSILLECTOMY       Social History   Tobacco Use   Smoking status: Every Day    Packs/day: 1.50    Years: 30.00    Total pack years: 45.00    Types: Cigarettes   Smokeless tobacco: Never   Tobacco comments:    uses e-cig  Substance Use Topics   Alcohol use: No    Alcohol/week: 0.0 standard drinks of alcohol    Comment: rarely   Drug use: No     Family History  Problem Relation Age of Onset   Stomach cancer Father   No known history of aneurysms.  No known history of clotting or bleeding disorders.  No Known Allergies   REVIEW OF SYSTEMS (Negative unless checked)  Constitutional: '[]'$ Weight loss  '[]'$ Fever  '[]'$ Chills Cardiac: '[]'$ Chest pain   '[]'$ Chest pressure   '[]'$ Palpitations   '[]'$ Shortness of breath when laying flat   '[]'$ Shortness of breath at rest   '[]'$ Shortness of breath with exertion. Vascular:  '[]'$ Pain in legs with walking   '[]'$   Pain in legs at rest   '[]'$ Pain in legs when laying flat   '[]'$ Claudication   '[]'$ Pain in feet when walking  '[]'$ Pain in feet at rest  '[]'$ Pain in feet when laying flat   '[]'$ History of DVT   '[]'$ Phlebitis   '[]'$ Swelling in legs   '[]'$ Varicose veins   '[]'$ Non-healing ulcers Pulmonary:   '[]'$ Uses home oxygen   '[]'$ Productive cough   '[]'$ Hemoptysis   '[]'$ Wheeze  '[]'$ COPD   '[]'$ Asthma Neurologic:  '[]'$ Dizziness  '[]'$ Blackouts   '[]'$ Seizures   '[]'$ History of stroke   '[]'$ History of TIA  '[]'$ Aphasia   '[]'$ Temporary blindness   '[]'$ Dysphagia   '[]'$ Weakness or numbness in arms   '[]'$ Weakness or numbness in legs Musculoskeletal:  '[]'$ Arthritis   '[]'$ Joint swelling   '[]'$ Joint pain   '[x]'$ Low back pain Hematologic:  '[]'$ Easy bruising  '[]'$ Easy bleeding   '[]'$ Hypercoagulable state    '[]'$ Anemic  '[]'$ Hepatitis Gastrointestinal:  '[]'$ Blood in stool   '[]'$ Vomiting blood  '[]'$ Gastroesophageal reflux/heartburn   '[]'$ Difficulty swallowing. X positive for abdominal and back pain Genitourinary:  '[]'$ Chronic kidney disease   '[]'$ Difficult urination  '[]'$ Frequent urination  '[]'$ Burning with urination   '[]'$ Blood in urine Skin:  '[]'$ Rashes   '[]'$ Ulcers   '[]'$ Wounds Psychological:  '[]'$ History of anxiety   '[]'$  History of major depression.  Physical Examination  Vitals:   07/01/2022 1908 07/15/2022 1911 07/02/2022 1916 06/27/2022 1918  BP: (!) 74/63 (!) 117/103 (!) 145/89 139/82  Pulse: (!) 119 (!) 117 (!) 119 (!) 115  Resp: (!) 35 (!) 32 (!) 27 (!) 40  Temp:      TempSrc:      SpO2:       There is no height or weight on file to calculate BMI. Gen: WD/WN, critically ill, tachypneic and tachycardic in obvious discomfort Head: [place]/AT, No temporalis wasting.  Ear/Nose/Throat: Hearing grossly intact, nares w/o erythema or drainage, oropharynx w/o Erythema/Exudate,  Eyes: Conjunctiva clear, sclera non-icteric Neck: Trachea midline.  No JVD.  Pulmonary:  Good air movement, respirations not labored, no use of accessory muscles.  Cardiac: Tachycardic Vascular:  Vessel Right Left  Radial Palpable Palpable                          PT Not palpable Not palpable  DP Not palpable Not palpable   Gastrointestinal: Distended and very tender to palpation Musculoskeletal: M/S 5/5 throughout. No deformity or atrophy.  Neurologic: Sensation grossly intact in extremities.  Symmetrical.  Speech is fluent. Motor exam as listed above. Psychiatric: Judgment intact, Mood & affect appropriate for pt's clinical situation. Dermatologic: No rashes or ulcers noted.  No cellulitis or open wounds. Lymph : No Cervical, Axillary, or Inguinal lymphadenopathy.     CBC Lab Results  Component Value Date   WBC 12.3 (H) 07/06/2022   HGB 8.6 (L) 07/01/2022   HCT 24.5 (L) 06/28/2022   MCV 125.6 (H) 07/13/2022   PLT 159 06/21/2022     BMET    Component Value Date/Time   NA 133 (L) 06/21/2022 1803   K 4.8 06/25/2022 1803   CL 100 06/30/2022 1803   CO2 20 (L) 06/21/2022 1803   GLUCOSE 168 (H) 07/14/2022 1803   BUN 54 (H) 07/01/2022 1803   CREATININE 3.51 (H) 06/26/2022 1803   CALCIUM 8.6 (L) 06/22/2022 1803   GFRNONAA 18 (L) 07/19/2022 1803   GFRAA >60 03/10/2020 1354   CrCl cannot be calculated (Unknown ideal weight.).  COAG No results found for: "INR", "PROTIME"  Radiology CT Renal Stone  Study  Result Date: 07/19/2022 CLINICAL DATA:  Left flank pain starting on Sunday EXAM: CT ABDOMEN AND PELVIS WITHOUT CONTRAST TECHNIQUE: Multidetector CT imaging of the abdomen and pelvis was performed following the standard protocol without IV contrast. RADIATION DOSE REDUCTION: This exam was performed according to the departmental dose-optimization program which includes automated exposure control, adjustment of the mA and/or kV according to patient size and/or use of iterative reconstruction technique. COMPARISON:  Abdominal radiograph of 01/06/2009 and report from CT abdomen from 02/26/2000 FINDINGS: Lower chest: Left anterior descending and right coronary artery atherosclerotic calcification. Descending thoracic aortic atherosclerosis. Small left pleural effusion. Small type 1 hiatal hernia. Emphysema noted at the lung bases. Mild scarring or atelectasis in both lower lobes. Hepatobiliary: Unremarkable Pancreas: Unremarkable Spleen: Unremarkable Adrenals/Urinary Tract: Both adrenal glands appear normal. There is some hematoma in the left perirenal space due to the aortic findings, see below. There is also moderate left hydronephrosis related to a 1.9 cm left ureteropelvic junction stone. Mild urinary bladder wall thickening is probably from nondistention, less likely from cystitis. Two additional left renal calculi are present but are nonobstructive, the larger measuring 0.5 cm in the lower pole. There is 0.4 cm right kidney  lower pole nonobstructive renal calculus. Stomach/Bowel: The transverse duodenum is displaced in indistinct due to the large ruptured infrarenal abdominal aortic aneurysm. Anastomotic staple line in the distal sigmoid colon. Vascular/Lymphatic: Ruptured infrarenal abdominal aortic aneurysm. Complex appearance with some concentric partial ring calcifications; the outer margin of the aneurysm measures about 10.8 by 10.3 cm in diameter, but parts of this likely represent thrombus of different ages. There is retroperitoneal stranding in hematoma as well as adjacent hematoma tracking in the left retroperitoneum anterior to the left iloipsoas and towards the left groin. I estimate the retroperitoneal hemorrhage at about 380 cc with acute hemorrhagic components. There is a small amount of periaortic hematoma as well. The aneurysm starts just below the level of the renal arteries and extends down to the bifurcation. There is a small amount of right retroperitoneal stranding/hematoma as well as a trace amount of presacral edema. Reproductive: Unremarkable Other: There is some mildly complex perihepatic ascites, potentially a small hemoperitoneum. Musculoskeletal: Mild bridging spurring of the sacroiliac joints. Moderate degenerative arthropathy of both hips. Small umbilical hernia contains adipose tissue. IMPRESSION: 1. Ruptured infrarenal abdominal aortic aneurysm, with 380 cc of blood products in the left retroperitoneum tracking primarily down along the iliopsoas towards the groin. There is a small amount of periaortic hematoma as well. Although there are complex elements inside the aneurysm, the outer wall of the aneurysm measures about 10.8 by 10.3 cm. The aneurysm starts just below the renal arteries and extends to the bifurcation. Emergent vascular surgical consultation recommended. 2. Small amount of complex perihepatic ascites, possibly hemoperitoneum. 3. Moderate left hydronephrosis related to a 1.9 cm left  ureteropelvic junction stone. 4. Bilateral nonobstructive nephrolithiasis. 5. Small left pleural effusion. 6. Small type 1 hiatal hernia. 7. Coronary atherosclerosis. 8. Emphysema. 9. Moderate degenerative arthropathy of both hips. 10. Small umbilical hernia contains adipose tissue. Aortic Atherosclerosis (ICD10-I70.0) and Emphysema (ICD10-J43.9). Critical Value/emergent results were called by telephone at the time of interpretation on 07/08/2022 at 6:40 pm to provider [facility] , who verbally acknowledged these results. Electronically Signed   By: Van Clines M.D.   On: 06/20/2022 18:54     Assessment/Plan 1. Ruptured AAA.  This is an emergent and critical situation with a very low likelihood of survival.  I have discussed with  the patient who is still awake and alert and his wife that our only chance of survival is an emergent repair and this will be done as soon as possible.  I have also discussed with him that his likelihood of survival is very small even with an attempt at repair.  His aneurysm appears to be very near the renal arteries and he has severe chronic kidney disease, so getting a seal to stop the bleeding will likely require covering both renal arteries and putting him on dialysis.  They are understanding of that. 2.  Chronic kidney disease.  Severe.  Stage IV-V.  With this procedure, I would expect permanent end-stage renal disease if he survives. 3.  Hypertension.  An underlying cause of his aneurysm.  At this point, he has hypotension from bleeding.   Leotis Pain, MD  06/27/2022 7:24 PM

## 2022-07-03 NOTE — ED Notes (Signed)
[place] to bedside

## 2022-07-03 NOTE — ED Provider Notes (Signed)
[facility] Provider Note    Event Date/Time   First MD Initiated Contact with Patient 07/14/2022 1821     (approximate)   History   Flank Pain   HPI  Jonathan Evans is a 71 y.o. male with history of kidney stones who comes in with concern for a kidney stone.  Patient noted to be hypotensive.  Patient reports having pain that started 1 to 2 days ago's rating into his groin with some decreased urination.  He reports that he had a near syncopal episode as well.    Physical Exam   Triage Vital Signs: ED Triage Vitals  Enc Vitals Group     BP 07/01/2022 1803 (!) 94/55     Pulse Rate 06/20/2022 1800 (!) 117     Resp 07/06/2022 1800 16     Temp 07/19/2022 1800 98.4 F (36.9 C)     Temp Source 06/20/2022 1800 Oral     SpO2 06/25/2022 1800 95 %     Weight --      Height --      Head Circumference --      Peak Flow --      Pain Score 07/13/2022 1800 5     Pain Loc --      Pain Edu? --      Excl. in [place]? --     Most recent vital signs: Vitals:   06/20/2022 1800 06/30/2022 1803  BP:  (!) 94/55  Pulse: (!) 117   Resp: 16   Temp: 98.4 F (36.9 C)   SpO2: 95%      General: Awake, no distress.  CV:  Good peripheral perfusion.  Resp:  Normal effort.  Abd:  Abdomen is distended.  Tender to palpation Other:     ED Results / Procedures / Treatments   Labs (all labs ordered are listed, but only abnormal results are displayed) Labs Reviewed  BASIC METABOLIC PANEL - Abnormal; Notable for the following components:      Result Value   Sodium 133 (*)    CO2 20 (*)    Glucose, Bld 168 (*)    BUN 54 (*)    Creatinine, Ser 3.51 (*)    Calcium 8.6 (*)    GFR, Estimated 18 (*)    All other components within normal limits  CBC - Abnormal; Notable for the following components:   WBC 12.3 (*)    RBC 1.95 (*)    Hemoglobin 8.6 (*)    HCT 24.5 (*)    MCV 125.6 (*)    MCH 44.1 (*)    All other components within normal limits  URINALYSIS, ROUTINE W REFLEX MICROSCOPIC      RADIOLOGY I have reviewed the CT personally interpreted and patient has large aneurysm with rupture  PROCEDURES:  Critical Care performed: Yes, see critical care procedure note(s)  .Critical Care  Performed by: Vanessa Lake, MD Authorized by: Vanessa Gildford, MD   Critical care provider statement:    Critical care time (minutes):  75   Critical care was necessary to treat or prevent imminent or life-threatening deterioration of the following conditions: ruptured aneuysm.   Critical care was time spent personally by me on the following activities:  Development of treatment plan with patient or surrogate, discussions with consultants, evaluation of patient's response to treatment, examination of patient, ordering and review of laboratory studies, ordering and review of radiographic studies, ordering and performing treatments and interventions, pulse oximetry, re-evaluation of  patient's condition and review of old charts    MEDICATIONS ORDERED IN ED: Medications  0.9 %  sodium chloride infusion (has no administration in time range)  norepinephrine (LEVOPHED) '4mg'$  in 236m (0.016 mg/mL) premix infusion (has no administration in time range)  sodium chloride 0.9 % bolus 1,000 mL (1,000 mLs Intravenous New Bag/Given 07/09/2022 1840)  ceFAZolin (ANCEF) IVPB 2g/100 mL premix (0 g Intravenous Stopped 06/28/2022 1945)     IMPRESSION / MDM / ASSESSMENT AND PLAN / ED COURSE  I reviewed the triage vital signs and the nursing notes.   Patient's presentation is most consistent with acute presentation with potential threat to life or bodily function.   Patient comes in with abdominal pain, back pain concern for kidney stone but noted to be hypotensive and I noted a low hemoglobin.  When I initially saw patient after being roomed I was immediately concerned for AAA.  Went to go get a ultrasound but CT was already there to take him to CT therefore patient was taken and sent to CT scan and noted to  have ruptured aneurysm.  I immediately called Dr. DLucky Cowboywho came in to see the patient.  I then saw the triage note that stated that patient did have a syncopal episode he does report hitting his head.  I consider getting a CT head but given patient's high mortality without the surgery do not want to delay the surgery.  I discussed this with Dr. DLucky Cowboyand if he can survive the surgery they can get it but did not recommend going to CT for his head given priority at this time is going to the OR.  2 units of emergency release blood were used and then patient had massive transfusion protocol activated.  We had difficulties finding a Cordis to insert but a nurse was able to get an 18 into the EJ and patient had 2 other good 20-gauge's.  We were able to get a Cordis I discussed with Dr. DLucky Cowboyif you want me to place it but they would prefer him just getting down to the OR  I had a very lengthy discussion with family about the high mortality rate they expressed understanding.  We discussed that if his heart was to stop the mortality is significantly high and after further discussion with Dr. DLucky Cowboyhe would not recommend resuscitation due to low utility and exceedingly poor outcomes.  Patient's and family expressed understanding and patient stated that he understood that if his heart was to stop prior to surgery what it would mean.  Patient came back with labs with low hemoglobin, elevated creatinine.  We now did mention that he also did hit his head when he passed out.  Patient has been alert and oriented has been able to make decisions in regards to the above care.  The chance for large head bleed is very low and I discussed with Dr. DLucky Cowboy that they are try to get him to the OR now.  I did alert the ICU team that if he does make it through surgery that after surgery he probably should get a CT head given he will be intubated.  However this time I suspicion for head bleed that is life-threatening is low and he currently has a  life-threatening AAA rupture.  I did discuss with Dr. KMortimer Friesas well so he was aware amputation potentially need a CT head after surgery he expressed understanding   FINAL CLINICAL IMPRESSION(S) / ED DIAGNOSES   Final diagnoses:  Ruptured aneurysm of thoracoabdominal aorta, unspecified part ([place])  Shock ([place])     Rx / DC Orders   ED Discharge Orders     None        Note:  This document was prepared using Dragon voice recognition software and may include unintentional dictation errors.   Vanessa [place], MD 07/15/2022 [phone]

## 2022-07-03 NOTE — ED Notes (Signed)
Emergency Consent signed for Ruptured AAA

## 2022-07-03 NOTE — ED Triage Notes (Signed)
Pt to ED via [facility] from home for left flank pain. Pt states that the pain started on Sunday. Pt reports that today he was using the rest room and had a syncopal episode. Pt states that he has not been able to eat due to vomiting after eating. Pt has hx/o stones.

## 2022-07-03 NOTE — Anesthesia Preprocedure Evaluation (Signed)
Anesthesia Evaluation  Patient identified by MRN, date of birth, ID band Patient awake    Reviewed: Allergy & Precautions, H&P , NPO status , Patient's Chart, lab work & pertinent test results, reviewed documented beta blocker date and time   Airway Mallampati: II  TM Distance: >3 FB Neck ROM: full    Dental  (+) Teeth Intact   Pulmonary neg pulmonary ROS, Current Smoker   Pulmonary exam normal        Cardiovascular negative cardio ROS Normal cardiovascular exam Rhythm:regular Rate:Normal     Neuro/Psych negative neurological ROS  negative psych ROS   GI/Hepatic negative GI ROS, Neg liver ROS,,,  Endo/Other  negative endocrine ROS    Renal/GU negative Renal ROS  negative genitourinary   Musculoskeletal   Abdominal   Peds  Hematology negative hematology ROS (+)   Anesthesia Other Findings Past Medical History: No date: Polycythemia vera ([place]) No date: Thrombocytosis No date: Tobacco abuse Past Surgical History: 05/14/2022: CATARACT EXTRACTION W/PHACO; Right     Comment:  Procedure: CATARACT EXTRACTION PHACO AND INTRAOCULAR               LENS PLACEMENT (IOC) RIGHT 12.65 01:10.7;  Surgeon: Eulogio Bear, MD;  Location: [place];                Service: Ophthalmology;  Laterality: Right; 05/28/2022: CATARACT EXTRACTION W/PHACO; Left     Comment:  Procedure: CATARACT EXTRACTION PHACO AND INTRAOCULAR               LENS PLACEMENT ([place]) LEFT;  Surgeon: Eulogio Bear,               MD;  Location: [place];  Service:               Ophthalmology;  Laterality: Left;  7.35 00:50.3 2001: CLOSURE ENTEROVESICAL FISTULA W/BOWEL &/OR BLADDER RESECTION No date: KNEE ARTHROSCOPY W/ MENISCAL REPAIR; Right No date: TONSILLECTOMY   Reproductive/Obstetrics negative OB ROS                             Anesthesia Physical Anesthesia Plan  ASA: 5 and  emergent  Anesthesia Plan: General ETT   Post-op Pain Management:    Induction:   PONV Risk Score and Plan:   Airway Management Planned:   Additional Equipment:   Intra-op Plan:   Post-operative Plan:   Informed Consent: I have reviewed the patients History and Physical, chart, labs and discussed the procedure including the risks, benefits and alternatives for the proposed anesthesia with the patient or authorized representative who has indicated his/her understanding and acceptance.     Dental Advisory Given  Plan Discussed with: CRNA  Anesthesia Plan Comments:        Anesthesia Quick Evaluation

## 2022-07-03 NOTE — ED Notes (Signed)
2nd unit of blood complete

## 2022-07-03 NOTE — H&P (Signed)
NAME:  Jonathan Evans, MRN:  [id number], DOB:  [birth year], LOS: 0 ADMISSION DATE:  06/25/2022, CONSULTATION DATE:07/09/2022 REFERRING MD: Dr. Jari Pigg, CHIEF COMPLAINT: Flank Pain   History of Present Illness:  This is a 71 yo male who presented to [facility] via EMS on 11/14 following a syncopal episode at home resulting in a fall and pt hitting his head today while using the restroom.  He also endorsed left flank pain and nausea/vomiting with inability to tolerate po's.   ED Course  Upon arrival to the ER pt alert and oriented with continued complaints of flank pain.  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  Pt received a total of 4 units of emergent blood.  Vascular Surgery emergently consulted by ER physician, and pt transported emergently to the OR for ruptured juxtarenal AAA repair.    Lab results: Na+ 133/CO2 20/glucose 168/BUN 54/creatinine 3.51/calcium 8.6/wbc 12.3/hgb 8.6  CT Renal Stone Study: Ruptured infrarenal abdominal aortic aneurysm, with 380 cc of blood products in the left retroperitoneum tracking primarily down along the iliopsoas towards the groin. There is a small amount of periaortic hematoma as well. Although there are complex elements inside the aneurysm, the outer wall of the aneurysm measures about 10.8 by 10.3 cm. The aneurysm starts just below the renal arteries and extends to the bifurcation. Emergent vascular surgical consultation recommended. Small amount of complex perihepatic ascites, possibly hemoperitoneum. Moderate left hydronephrosis related to a 1.9 cm left ureteropelvic junction stone. Bilateral nonobstructive nephrolithiasis. Small left pleural effusion. Small type 1 hiatal hernia. Coronary atherosclerosis. Emphysema. Moderate degenerative arthropathy of both hips. Small umbilical hernia contains adipose tissue. Aortic Atherosclerosis (ICD10-I70.0) and Emphysema (ICD10-J43.9).  Pertinent  Medical  History  Kidney Stones  Polycythemia Vera  Essential Thrombocytosis  Osteoarthritis  Tobacco Abuse  Chronic Kidney Disease   Significant Hospital Events: Including procedures, antibiotic start and stop dates in addition to other pertinent events   11/14: Pt admitted mechanically intubated s/p ruptured juxtarenal AAA repair   Interim History / Subjective:  Pt remains mechanically intubated currently requiring levophed gtt _0  mcg/min to maintain map >65.  Pt with severe abdominal distension with firmness. Spoke with vascular surgeon Dr. Lucky Cowboy who stated prior to surgery pt had severe abdominal distension with firmness and this was not a new finding.   Objective   Blood pressure 119/82, pulse (!) 112, temperature 98.4 F (36.9 C), temperature source Oral, resp. rate (!) 24, SpO2 95 %.        Intake/Output Summary (Last 24 hours) at 07/09/2022 2022 Last data filed at 07/17/2022 1945 Gross per 24 hour  Intake 100 ml  Output --  Net 100 ml   There were no vitals filed for this visit.  Examination: General: Acutely-ill appearing male, NAD mechanically intubated  HENT: Supple, no JVD  Lungs: Rhonchi with expiratory wheezes throughout, even non labored  Cardiovascular: Sinus tachycardia, s1s2, no r/g, 2+ radial/1+ posterior tibial pulses via doppler, no edema  Abdomen: No audible BS, severe abdominal distension and firmness Extremities: Normal bulk and tone  Skin: Bilateral groin vascular sites with derma bond intact/well approximated with bilateral PAD's in place  Neuro: Sedated, not following commands, bilateral pupils pin point and very sluggish  GU: Pending indwelling foley catheter placement   Resolved Hospital Problem list     Assessment & Plan:  Intraoperative Mechanical Intubation  Hx: Tobacco abuse  - Full vent support for now: vent settings reviewed and  established  - SBT once all parameters met - VAP bundle implemented - Intermittent CXR and ABG's  - Maintain RASS  goal 0 to -1 - PAD protocol to maintain RASS goal: Fentanyl and propofol gtts  - WUA daily  - Will need tobacco abuse cessation counseling once extubated   Ruptured juxtarenal AAA s/p vascular repair Hemorrhagic shock  - Continuous telemetry monitoring  - Trend troponin's until peaked  - EKG pending  - IV maintenance fluids; prn pRBC's, and levophed gtt to maintain map >65 - Vascular surgery consulted appreciate input: per recommendations start aspirin and plavix; neurovascular checks per protocol   Acute kidney injury on stage 3A CKD secondary to ruptured juxtarenal AAA - Trend BMP - Replace electrolytes as indicated  - Monitor UOP - Avoid nephrotoxic medications  - Will consult nephrology appreciate input   Acute blood loss anemia  Hx: Polycythemia vera  - Trend CBC and coags  - Monitor for s/sx of bleeding  - Transfuse for hgb <8 and/or signs of active bleeding   Hyperglycemia  - CBG's q4hrs  - SSI  - Hemoglobin A1c pending    Best Practice (right click and "Reselect all SmartList Selections" daily)   Diet/type: NPO DVT prophylaxis: SCD GI prophylaxis: H2B Lines: N/A Foley:  Yes, and it is still needed Code Status:  full code Last date of multidisciplinary goals of care discussion [N/A]  Updated pts wife, son, and daughter-in-law regarding current pt condition and plan of care.  Discussed code status and following discussion code status Full Code.  All questions were answered  Labs   CBC: Recent Labs  Lab 07/16/2022 1803  WBC 12.3*  HGB 8.6*  HCT 24.5*  MCV 125.6*  PLT 664    Basic Metabolic Panel: Recent Labs  Lab 07/14/2022 1803  NA 133*  K 4.8  CL 100  CO2 20*  GLUCOSE 168*  BUN 54*  CREATININE 3.51*  CALCIUM 8.6*   GFR: CrCl cannot be calculated (Unknown ideal weight.). Recent Labs  Lab 07/02/2022 1803  WBC 12.3*    Liver Function Tests: No results for input(s): "AST", "ALT", "ALKPHOS", "BILITOT", "PROT", "ALBUMIN" in the last 168  hours. No results for input(s): "LIPASE", "AMYLASE" in the last 168 hours. No results for input(s): "AMMONIA" in the last 168 hours.  ABG No results found for: "PHART", "PCO2ART", "PO2ART", "HCO3", "TCO2", "ACIDBASEDEF", "O2SAT"   Coagulation Profile: Recent Labs  Lab 06/30/2022 1930  INR 1.6*    Cardiac Enzymes: No results for input(s): "CKTOTAL", "CKMB", "CKMBINDEX", "TROPONINI" in the last 168 hours.  HbA1C: No results found for: "HGBA1C"  CBG: No results for input(s): "GLUCAP" in the last 168 hours.  Review of Systems:   Unable to assess pt mechanically intubated   Past Medical History:  He,  has a past medical history of Polycythemia vera ([place]), Thrombocytosis, and Tobacco abuse.   Surgical History:   Past Surgical History:  Procedure Laterality Date   CATARACT EXTRACTION W/PHACO Right 05/14/2022   Procedure: CATARACT EXTRACTION PHACO AND INTRAOCULAR LENS PLACEMENT (I[facility]) RIGHT 12.65 01:10.7;  Surgeon: Eulogio Bear, MD;  Location: [place];  Service: Ophthalmology;  Laterality: Right;   CATARACT EXTRACTION W/PHACO Left 05/28/2022   Procedure: CATARACT EXTRACTION PHACO AND INTRAOCULAR LENS PLACEMENT ([place]) LEFT;  Surgeon: Eulogio Bear, MD;  Location: [place];  Service: Ophthalmology;  Laterality: Left;  7.35 00:50.3   CLOSURE ENTEROVESICAL FISTULA W/BOWEL &/OR BLADDER RESECTION  2001   KNEE ARTHROSCOPY W/ MENISCAL REPAIR Right  TONSILLECTOMY       Social History:   reports that he has been smoking cigarettes. He has a 45.00 pack-year smoking history. He has never used smokeless tobacco. He reports that he does not drink alcohol and does not use drugs.   Family History:  His family history includes Stomach cancer in his father.   Allergies No Known Allergies   Home Medications  Prior to Admission medications   Medication Sig Start Date End Date Taking? Authorizing Provider  aspirin 325 MG tablet Take 325 mg by mouth daily.     [provider]  diclofenac (VOLTAREN) 75 MG EC tablet Take 1 tablet by mouth daily. 04/15/16   [provider]  hydroxyurea (HYDREA) 500 MG capsule TAKE 2 CAPSULES BY MOUTH ONCE  DAILY WITH FOOD 05/14/22   Cammie Sickle, MD  ibuprofen (ADVIL,MOTRIN) 200 MG tablet Take 200 mg by mouth every 6 (six) hours as needed.    [provider]     Critical care time: 66 minutes      Donell Beers, [place] Pager [phone] (please enter 7 digits) PCCM Consult Pager [phone] (please enter 7 digits)

## 2022-07-03 NOTE — Progress Notes (Signed)
Attempted to insert right radial arterial line, however upon threading catheter blood flow stopped.  Therefore, attempted to insert left radial arterial line however blood flow stopped when threading catheter.  Will continue to monitor and assess pt.   Donell Beers, [place] Pager [phone] (please enter 7 digits) PCCM Consult Pager [phone] (please enter 7 digits)

## 2022-07-03 NOTE — ED Notes (Addendum)
3rd unit emergency Blood infusing @ 177m/min via rapid infuser

## 2022-07-03 NOTE — ED Notes (Signed)
149m/min unit 1 on [place] rapid infuser

## 2022-07-03 NOTE — ED Notes (Addendum)
Arrival MD Leotis Pain

## 2022-07-03 NOTE — Progress Notes (Signed)
eLink Physician-Brief Progress Note Patient Name: Jonathan Evans DOB: 1950-12-16 MRN: [id number]   Date of Service  07/13/2022  HPI/Events of Note  Patient admitted to ICU with ruptured juxta renal AAA s/p operative repair, intubated, sedated, and mechanically ventilated. He received multiple PRBC transfusion for hemorrhagic shock in the pre and Peri-operative period.  eICU Interventions  New Patient Evaluation.        Kerry Kass Adelheid Hoggard 06/25/2022, 10:32 PM

## 2022-07-03 NOTE — ED Notes (Signed)
Dr. Jari Pigg states to slow down infusions as pt's pressure is 152 78

## 2022-07-03 NOTE — ED Notes (Signed)
Transporting patient to surgery with Dew MD present at bedside x 3 RNs

## 2022-07-03 NOTE — Consult Note (Signed)
PHARMACY CONSULT NOTE - FOLLOW UP  Pharmacy Consult for Electrolyte Monitoring and Replacement   Recent Labs: Potassium (mmol/L)  Date Value  06/30/2022 4.8   Calcium (mg/dL)  Date Value  06/22/2022 8.6 (L)   Albumin (g/dL)  Date Value  04/03/2022 3.8   Sodium (mmol/L)  Date Value  06/21/2022 133 (L)     Assessment: Patient admitted with ruptured AAA and advanced CKD. Vascular surgery performed to stop bleeding. Pharmacy consulted to replace electrolytes as needed.  Goal of Therapy:  Electrolytes WNL  Plan:  K 4.8, Cl 100, no Mg level obtained on admission. No need for electrolytes replacement at this time. Will  follow up AM labs and replace as needed  Annete Ayuso Rodriguez-Guzman PharmD, BCPS 06/22/2022 9:30 PM

## 2022-07-03 NOTE — ED Notes (Signed)
Dr. Jari Pigg to bedside

## 2022-07-03 NOTE — Procedures (Signed)
Central Venous Catheter Insertion Procedure Note  Jonathan Evans  [id number]  09-23-1950  Date:06/28/2022  Time:10:56 PM   Provider Performing:Saben Donigan Micheline Chapman   Procedure: Insertion of Non-tunneled Central Venous [id number]) with US guidance (72094)   Indication(s) Medication administration  Consent Risks of the procedure as well as the alternatives and risks of each were explained to the patient and/or caregiver.  Consent for the procedure was obtained and is signed in the bedside chart  Anesthesia Topical only with 1% lidocaine   Timeout Verified patient identification, verified procedure, site/side was marked, verified correct patient position, special equipment/implants available, medications/allergies/relevant history reviewed, required imaging and test results available.  Sterile Technique Maximal sterile technique including full sterile barrier drape, hand hygiene, sterile gown, sterile gloves, mask, hair covering, sterile ultrasound probe cover (if used).  Procedure Description Area of catheter insertion was cleaned with chlorhexidine and draped in sterile fashion.  With real-time ultrasound guidance a central venous catheter was placed into the right internal jugular vein. Nonpulsatile blood flow and easy flushing noted in all ports.  The catheter was sutured in place and sterile dressing applied.  Complications/Tolerance None; patient tolerated the procedure well. Chest X-ray is ordered to verify placement for internal jugular or subclavian cannulation.   Chest x-ray is not ordered for femoral cannulation.  EBL Minimal  Specimen(s) None  Donell Beers, AGNP  Pulmonary/Critical Care Pager [phone] (please enter 7 digits) PCCM Consult Pager [phone] (please enter 7 digits)

## 2022-07-03 NOTE — Op Note (Addendum)
OPERATIVE NOTE   PROCEDURE: US guidance for vascular access, bilateral femoral arteries Catheter placement into aorta from bilateral femoral approaches Placement of a 28 mm proximal 12 cm length Gore Excluder Endoprosthesis main body right with a 12 mm diameter by 14 cm length left iliac contralateral limb Placement of an additional left iliac limb with a 14 mm diameter by 14 cm length extension Placement of additional right iliac limb x2 with a 14 mm diameter by 14 cm extension and a 16 mm diameter by 12 cm extension ProGlide closure devices bilateral femoral arteries  PRE-OPERATIVE DIAGNOSIS: AAA, ruptured juxtarenal, severe chronic kidney disease Nearing dialysis dependence  POST-OPERATIVE DIAGNOSIS: same  SURGEON: Leotis Pain, MD   ANESTHESIA: none  ESTIMATED BLOOD LOSS: 10 cc  FINDING(S): 1.  AAA, ruptured  SPECIMEN(S):  none  INDICATIONS:   Jonathan Evans is a 71 y.o. male who presents with a ruptured juxtarenal abdominal aortic aneurysm.  The patient has severe chronic kidney disease already adhering to dialysis dependence and he and his wife understand that likely the only way for him to survive to recover at least the right renal artery and potentially both renal arteries for seal which would lead to dialysis. The anatomy was suitable for endovascular repair with coverage of the right renal artery and potentially the left renal artery.  Risks and benefits of repair in an endovascular fashion were discussed and informed consent was obtained. Co-surgeons are used to expedite the procedure and reduce operative time as bilateral work needs to be done.  DESCRIPTION: After obtaining full informed written consent, the patient was brought back to the operating room and placed supine upon the operating table.  The patient received IV antibiotics prior to induction.  After obtaining adequate anesthesia, the patient was prepped and draped in the standard fashion for endovascular AAA  repair.  We then began by gaining access to both femoral arteries with US guidance.  The femoral arteries were found to be patent and accessed without difficulty with a needle under ultrasound guidance without difficulty on each side and permanent images were recorded.  We then placed 2 proglide devices on each side in a pre-close fashion and placed 8 French sheaths. The patient was then given 4000 units of intravenous heparin. The Pigtail catheter was placed into the aorta from the left side. Using this image, we selected a 28 mm diameter by 12 cm length Main body device.  Over a stiff wire, an 46 French sheath was placed up the right side. The main body was then placed through the 18 French sheath on the right side. A Kumpe catheter was placed up the left side and a magnified image at the renal arteries was performed. The main body was then deployed above the right renal artery and at the base of the left renal artery. The Kumpe catheter was used to cannulate the contralateral gate without difficulty and successful cannulation was confirmed by twirling the pigtail catheter in the main body. We then placed a stiff wire and a retrograde arteriogram was performed through the left femoral sheath. We upsized to the 12 [place] sheath on the left for the contralateral limb and a 12 mm diameter by 14 cm length left limb was selected and deployed. The main body deployment was then completed. Based off the angiographic findings, extension limbs were necessary on both sides.  On the left side, an additional 14 mm diameter by 14 cm length extension limb was taken down to just above  the left hypogastric artery.  On the right side, a 14 mm diameter by 14 cm length extension limb was taken down to just above the right hypogastric artery.  There was only 2 cm of seal between the extension limb in the main body, so an additional 16 mm diameter by 12 cm length iliac device was placed on the main right side to bridge and extend the  overlap. All junction points and seals zones were treated with the compliant balloon. The pigtail catheter was then replaced and a completion angiogram was performed.  No apparent endoleak was detected on completion angiography.  The aneurysm sac no longer filled.  There was brisk flow through the stent graft.  The left renal artery still below stent graft at the base of the left renal artery and the SMA had flow.  The right renal artery was not seen but this was expected.  Both hypogastric arteries were patent. At this point we elected to terminate the procedure. We secured the pro glide devices for hemostasis on the femoral arteries. The skin incision was closed with a 4-0 Monocryl. Dermabond and pressure dressing were placed. The patient was taken to the recovery room in stable condition having tolerated the procedure well.  COMPLICATIONS: none  CONDITION: stable  Leotis Pain  06/27/2022, 9:11 PM   This note was created with Dragon Medical transcription system. Any errors in dictation are purely unintentional.

## 2022-07-03 NOTE — Transfer of Care (Signed)
Immediate Anesthesia Transfer of Care Note  Patient: Jonathan Evans  Procedure(s) Performed: ABDOMINAL AORTIC ENDOVASCULAR STENT GRAFT  Patient Location: ICU  Anesthesia Type:General  Level of Consciousness: Patient remains intubated per anesthesia plan  Airway & Oxygen Therapy: Patient remains intubated per anesthesia plan and Patient placed on Ventilator (see vital sign flow sheet for setting)  Post-op Assessment: Report given to RN and Post -op Vital signs reviewed and unstable, Anesthesiologist notified  Post vital signs: Reviewed and unstable  Last Vitals:  Vitals Value Taken Time  BP 87/70 07/16/2022 2225  Temp 37.6 C 07/08/2022 2140  Pulse 126 06/23/2022 2217  Resp 35 06/20/2022 2226  SpO2 100 % 07/17/2022 2217  Vitals shown include unvalidated device data.  Last Pain:  Vitals:   07/07/2022 2140  TempSrc: Oral  PainSc:        hypotension  Complications: No notable events documented.

## 2022-07-03 NOTE — ED Notes (Signed)
Pt. To CT, this RN followed pt. With cardiac monitoring and IV star supplies.

## 2022-07-03 NOTE — Progress Notes (Signed)
I responded to a call from the nurse to provide spiritual support for the patient's family while he was receiving medical care. I arrived at the hospital patient's room and provided spiritual support through pastoral presence and by leading in prayer.    07/13/2022 1942  Clinical Encounter Type  Visited With Patient and family together  Visit Type Initial;Spiritual support;ED  Referral From Nurse  Consult/Referral To Chaplain  Spiritual Encounters  Spiritual Needs Prayer    Chaplain Dr Redgie Grayer

## 2022-07-03 NOTE — ED Triage Notes (Signed)
Ems report: pt ems from home s/p syncopal  episode with fall. Pt also has flank pain since Sunday with N/V. Pt a/o, vss, cbg 203

## 2022-07-04 LAB — BPAM RBC
Blood Product Expiration Date: 202311282359
Blood Product Expiration Date: 202312012359
Blood Product Expiration Date: 202312022359
Blood Product Expiration Date: 202312152359
Blood Product Expiration Date: 202312182359
Blood Product Expiration Date: 202312192359
Blood Product Expiration Date: 202312192359
Blood Product Expiration Date: 202312202359
Blood Product Expiration Date: 202312202359
Blood Product Expiration Date: 202312202359
ISSUE DATE / TIME: 202311141845
ISSUE DATE / TIME: 202311141845
ISSUE DATE / TIME: 202311141910
ISSUE DATE / TIME: 202311141910
ISSUE DATE / TIME: 202311142038
ISSUE DATE / TIME: 202311142041
Unit Type and Rh: 5100
Unit Type and Rh: 5100
Unit Type and Rh: 5100
Unit Type and Rh: 5100
Unit Type and Rh: 5100
Unit Type and Rh: 5100
Unit Type and Rh: 5100
Unit Type and Rh: 7300
Unit Type and Rh: 7300
Unit Type and Rh: 7300

## 2022-07-04 LAB — TRIGLYCERIDES: Triglycerides: 82 mg/dL (ref <150)

## 2022-07-04 LAB — CBC WITH DIFFERENTIAL/PLATELET
Abs Immature Granulocytes: 0.06 10*3/uL (ref 0.00–0.07)
Basophils Absolute: 0 10*3/uL (ref 0.0–0.1)
Basophils Relative: 0 %
Eosinophils Absolute: 0 10*3/uL (ref 0.0–0.5)
Eosinophils Relative: 0 %
HCT: 16.6 % — ABNORMAL LOW (ref 39.0–52.0)
Hemoglobin: 5.3 g/dL — ABNORMAL LOW (ref 13.0–17.0)
Immature Granulocytes: 1 %
Lymphocytes Relative: 9 %
Lymphs Abs: 0.5 10*3/uL — ABNORMAL LOW (ref 0.7–4.0)
MCH: 32.1 pg (ref 26.0–34.0)
MCHC: 31.9 g/dL (ref 30.0–36.0)
MCV: 100.6 fL — ABNORMAL HIGH (ref 80.0–100.0)
Monocytes Absolute: 0.3 10*3/uL (ref 0.1–1.0)
Monocytes Relative: 7 %
Neutro Abs: 4.1 10*3/uL (ref 1.7–7.7)
Neutrophils Relative %: 83 %
Platelets: 49 10*3/uL — ABNORMAL LOW (ref 150–400)
RBC: 1.65 MIL/uL — ABNORMAL LOW (ref 4.22–5.81)
RDW: 25.1 % — ABNORMAL HIGH (ref 11.5–15.5)
Smear Review: NORMAL
WBC: 4.9 10*3/uL (ref 4.0–10.5)
nRBC: 0.6 % — ABNORMAL HIGH (ref 0.0–0.2)

## 2022-07-04 LAB — TYPE AND SCREEN
ABO/RH(D): B POS
Antibody Screen: NEGATIVE
Unit division: 0
Unit division: 0
Unit division: 0
Unit division: 0
Unit division: 0
Unit division: 0
Unit division: 0
Unit division: 0
Unit division: 0
Unit division: 0

## 2022-07-04 LAB — BPAM FFP
Blood Product Expiration Date: 202311142359
Blood Product Expiration Date: 202311162359
Blood Product Expiration Date: 202311192359
Blood Product Expiration Date: 202311192359
ISSUE DATE / TIME: 202311091334
Unit Type and Rh: 6200
Unit Type and Rh: 6200
Unit Type and Rh: 7300
Unit Type and Rh: 7300

## 2022-07-04 LAB — HEPATIC FUNCTION PANEL
ALT: 11 U/L (ref 0–44)
AST: 23 U/L (ref 15–41)
Albumin: <1.5 g/dL — ABNORMAL LOW (ref 3.5–5.0)
Alkaline Phosphatase: 32 U/L — ABNORMAL LOW (ref 38–126)
Bilirubin, Direct: 0.3 mg/dL — ABNORMAL HIGH (ref 0.0–0.2)
Indirect Bilirubin: 0.7 mg/dL (ref 0.3–0.9)
Total Bilirubin: 1 mg/dL (ref 0.3–1.2)
Total Protein: <3 g/dL — ABNORMAL LOW (ref 6.5–8.1)

## 2022-07-04 LAB — TROPONIN I (HIGH SENSITIVITY): Troponin I (High Sensitivity): 76 ng/L — ABNORMAL HIGH (ref <18)

## 2022-07-04 LAB — PREPARE FRESH FROZEN PLASMA
Unit division: 0
Unit division: 0

## 2022-07-04 LAB — BLOOD GAS, ARTERIAL
Acid-base deficit: 11.8 mmol/L — ABNORMAL HIGH (ref 0.0–2.0)
Bicarbonate: 16.8 mmol/L — ABNORMAL LOW (ref 20.0–28.0)
FIO2: 100 %
MECHVT: 550 mL
Mechanical Rate: 16
O2 Saturation: 100 %
PEEP: 5 cmH2O
Patient temperature: 37
pCO2 arterial: 47 mmHg (ref 32–48)
pH, Arterial: 7.16 — CL (ref 7.35–7.45)
pO2, Arterial: 442 mmHg — ABNORMAL HIGH (ref 83–108)

## 2022-07-04 LAB — LACTIC ACID, PLASMA
Lactic Acid, Venous: 7.7 mmol/L (ref 0.5–1.9)
Lactic Acid, Venous: >9 mmol/L (ref 0.5–1.9)

## 2022-07-04 LAB — HEMOGLOBIN A1C
Hgb A1c MFr Bld: 5.2 % (ref 4.8–5.6)
Mean Plasma Glucose: 102.54 mg/dL

## 2022-07-04 LAB — BASIC METABOLIC PANEL
Anion gap: 7 (ref 5–15)
BUN: 47 mg/dL — ABNORMAL HIGH (ref 8–23)
CO2: 17 mmol/L — ABNORMAL LOW (ref 22–32)
Calcium: 5.8 mg/dL — CL (ref 8.9–10.3)
Chloride: 110 mmol/L (ref 98–111)
Creatinine, Ser: 3.16 mg/dL — ABNORMAL HIGH (ref 0.61–1.24)
GFR, Estimated: 20 mL/min — ABNORMAL LOW (ref >60)
Glucose, Bld: 342 mg/dL — ABNORMAL HIGH (ref 70–99)
Potassium: 5.6 mmol/L — ABNORMAL HIGH (ref 3.5–5.1)
Sodium: 134 mmol/L — ABNORMAL LOW (ref 135–145)

## 2022-07-04 LAB — MAGNESIUM: Magnesium: 2 mg/dL (ref 1.7–2.4)

## 2022-07-04 LAB — PHOSPHORUS: Phosphorus: 8.6 mg/dL — ABNORMAL HIGH (ref 2.5–4.6)

## 2022-07-04 MED ORDER — GLYCOPYRROLATE 0.2 MG/ML IJ SOLN: 0.2000 mg | INTRAMUSCULAR | Status: DC | PRN

## 2022-07-04 MED ORDER — DEXTROSE 50 % IV SOLN: 25.0000 mL | Freq: Once | INTRAVENOUS | Status: DC

## 2022-07-04 MED ORDER — SODIUM CHLORIDE 0.9 % IV SOLN: INTRAVENOUS | Status: DC

## 2022-07-04 MED ORDER — ACETAMINOPHEN 650 MG RE SUPP: 650.0000 mg | Freq: Four times a day (QID) | RECTAL | Status: DC | PRN

## 2022-07-04 MED ORDER — POLYVINYL ALCOHOL 1.4 % OP SOLN: 1.0000 [drp] | Freq: Four times a day (QID) | OPHTHALMIC | Status: DC | PRN

## 2022-07-04 MED ORDER — MORPHINE 100MG IN NS 100ML (1MG/ML) PREMIX INFUSION
0.0000 mg/h | INTRAVENOUS | Status: DC
  Administered 2022-07-04: 3 mg/h via INTRAVENOUS
  Filled 2022-07-04: qty 100

## 2022-07-04 MED ORDER — INSULIN ASPART 100 UNIT/ML IV SOLN
10.0000 [IU] | Freq: Once | INTRAVENOUS | Status: DC
  Filled 2022-07-04: qty 0.1

## 2022-07-04 MED ORDER — STERILE WATER FOR INJECTION IV SOLN
INTRAVENOUS | Status: DC
  Filled 2022-07-04: qty 1000

## 2022-07-04 MED ORDER — MORPHINE BOLUS VIA INFUSION
5.0000 mg | INTRAVENOUS | Status: DC | PRN
  Administered 2022-07-04: 5 mg via INTRAVENOUS

## 2022-07-04 MED ORDER — LACTATED RINGERS IV BOLUS
1000.0000 mL | Freq: Once | INTRAVENOUS | Status: AC
  Administered 2022-07-04: 1000 mL via INTRAVENOUS

## 2022-07-04 MED ORDER — LORAZEPAM 2 MG/ML IJ SOLN: 2.0000 mg | INTRAMUSCULAR | Status: DC | PRN

## 2022-07-04 MED ORDER — SODIUM BICARBONATE 8.4 % IV SOLN
100.0000 meq | Freq: Once | INTRAVENOUS | Status: AC
  Administered 2022-07-04: 100 meq via INTRAVENOUS
  Filled 2022-07-04: qty 100

## 2022-07-04 MED ORDER — CALCIUM GLUCONATE-NACL 1-0.675 GM/50ML-% IV SOLN
1.0000 g | Freq: Once | INTRAVENOUS | Status: DC
  Filled 2022-07-04: qty 50

## 2022-07-04 MED ORDER — SODIUM CHLORIDE 0.9% IV SOLUTION: Freq: Once | INTRAVENOUS | Status: DC

## 2022-07-05 ENCOUNTER — Encounter: Payer: Self-pay | Admitting: Vascular Surgery

## 2022-07-05 LAB — MASSIVE TRANSFUSION PROTOCOL ORDER (BLOOD BANK NOTIFICATION)

## 2022-07-05 LAB — PREPARE PLATELET PHERESIS: Unit division: 0

## 2022-07-05 LAB — BPAM PLATELET PHERESIS
Blood Product Expiration Date: 202311162359
Unit Type and Rh: 7300

## 2022-07-06 LAB — COMPREHENSIVE METABOLIC PANEL
ALT: 13 U/L (ref 0–44)
AST: 23 U/L (ref 15–41)
Albumin: 1.7 g/dL — ABNORMAL LOW (ref 3.5–5.0)
Alkaline Phosphatase: 45 U/L (ref 38–126)
Anion gap: 5 (ref 5–15)
BUN: 48 mg/dL — ABNORMAL HIGH (ref 8–23)
CO2: 18 mmol/L — ABNORMAL LOW (ref 22–32)
Calcium: 6.1 mg/dL — CL (ref 8.9–10.3)
Chloride: 112 mmol/L — ABNORMAL HIGH (ref 98–111)
Creatinine, Ser: 3.1 mg/dL — ABNORMAL HIGH (ref 0.61–1.24)
GFR, Estimated: 21 mL/min — ABNORMAL LOW (ref >60)
Glucose, Bld: 187 mg/dL — ABNORMAL HIGH (ref 70–99)
Potassium: 4.9 mmol/L (ref 3.5–5.1)
Sodium: 135 mmol/L (ref 135–145)
Total Bilirubin: 1.1 mg/dL (ref 0.3–1.2)
Total Protein: 3.4 g/dL — ABNORMAL LOW (ref 6.5–8.1)

## 2022-07-06 NOTE — Anesthesia Postprocedure Evaluation (Signed)
Anesthesia Post Note  Patient: Jonathan Evans  Procedure(s) Performed: ENDOVASCULAR REPAIR/STENT GRAFT (Bilateral)  Patient location during evaluation: PACU Anesthesia Type: General Level of consciousness: awake and alert Pain management: pain level controlled Vital Signs Assessment: post-procedure vital signs reviewed and stable Respiratory status: spontaneous breathing, nonlabored ventilation, respiratory function stable and patient connected to nasal cannula oxygen Cardiovascular status: blood pressure returned to baseline and stable Postop Assessment: no apparent nausea or vomiting Anesthetic complications: no   No notable events documented.   Last Vitals:  Vitals:   07-28-22 0030 07-28-2022 0035  BP: 98/72 (!) 84/45  Pulse: (!) 116 (!) 115  Resp: 17 17  Temp: (!) 35.4 C (!) 35.4 C  SpO2: 97% 95%    Last Pain:  Vitals:   06/30/2022 2140  TempSrc: Oral  PainSc:      Pt tolerated  procedure with labile blood pressure and volume resuscitation as indicated..  Transported to ICU. Stable throughout transport with transition over to ICU staff. Report given. Colman Cater

## 2022-07-20 NOTE — Progress Notes (Signed)
I responded to a page from the nurse to provide spiritual support for the patient and his family. I arrived at the patient's room where his wife, son, and daughter-in-law were present. I shared words of comfort, read scripture, and led in prayer.    2022/07/11 0226  Clinical Encounter Type  Visited With Patient and family together  Visit Type Follow-up;Spiritual support;Death  Referral From Nurse  Consult/Referral To Chaplain  Spiritual Encounters  Spiritual Needs Sacred text;Prayer;Grief support    Chaplain Dr Redgie Grayer

## 2022-07-20 NOTE — Progress Notes (Signed)
Pt extubated to room air per NP order. Pt family and chaplain at bedside.

## 2022-07-20 NOTE — IPAL (Signed)
  Interdisciplinary Goals of Care Family Meeting   Date carried out: 2022/07/12  Location of the meeting: Bedside  Member's involved: Nurse Practitioner, Bedside Registered Nurse, and Family Member or next of kin  Durable Power of Attorney or acting medical decision maker: Spouse [employer], pts Son; and pts Daugher-In-Law     Discussion: We discussed goals of care for [employer] .  Pt continuing to decline despite aggressive treatment.  Pt requiring 3 vasopressors with worsening hemoglobin along with worsening abdominal distension concerning for possible recurrent bleeding   Code status: Full DNR  Disposition: In-patient comfort care  Time spent for the meeting: 15 minutes     Donell Beers, [place] Pager [phone] (please enter 7 digits) [place] Pager [phone] (please enter 7 digits)

## 2022-07-20 NOTE — Progress Notes (Signed)
Pt's time of death: 07-21-22 at [place] heart and lung sound x1 min along w/Mariah RN ... No lungs or heart sounds auscultated.    No visible chest rise seen   Notified eLink  Printed asystole strip and placed in chart  Family at bedside w/chaplain... Family has not funeral home in mind in the moment but will call us with this information later on today.

## 2022-07-20 NOTE — Death Summary Note (Signed)
DEATH SUMMARY   Patient Details  Name: Jonathan Evans MRN: [id number] DOB: Aug 10, 1951  Admission/Discharge Information   Admit Date:  2022/07/21  Date of Death: Date of Death: 07-22-2022  Time of Death: Time of Death: 0155  Length of Stay: 1  Referring Physician: [place] Pink, MD   Reason(s) for Hospitalization  Flank Pain   Diagnoses  Preliminary cause of death: Ruptured abdominal aortic aneurysm (AAA) ([place]) Secondary Diagnoses (including complications and co-morbidities):  Principal Problem:   Ruptured abdominal aortic aneurysm (AAA) ([place]) Active Problems:   Acute on chronic renal failure ([place])   Hemorrhagic shock ([place])   Hyperglycemia   Shock [facility])   Brief Hospital Course (including significant findings, care, treatment, and services provided and events leading to death)  [name] is a 71 y.o. year old male who presented to [facility] ER on 21-Jul-2022 following a syncopal episode at home resulting in a fall and pt hitting his head while using the restroom.  He also endorsed left flank pain and nausea/vomiting with inability to tolerate po's. Upon arrival to the ER CT Renal Stone Study obtained which revealed a ruptured juxtarenal AAA.  Pt received a total of 4 units of emergent blood.  Vascular Surgery emergently consulted by ER physician, and pt transported emergently to the OR for ruptured juxtarenal AAA repair.     Postop pt admitted to the ICU mechanically intubated.  Despite aggressive treatment pt became hemodynamically unstable requiring multiple vasopressors with signs of possible acute abdominal bleeding hgb decreased from 8.0 to 5.3.  Following discussions with pts family code status changed to DNR, and per pts family request pt transitioned to Comfort Measures Only on 07/22/2022.  Pt expired on Jul 22, 2022 at 01:55 am with family at bedside.   Pertinent Labs and Studies  Significant Diagnostic Studies DG Chest Port 1 View  Result Date: 21-Jul-2022 CLINICAL DATA:  Central  line EXAM: PORTABLE CHEST 1 VIEW COMPARISON:  2022-07-21 FINDINGS: Esophageal tube tip is about 5.2 cm superior to carina. Esophageal tube tip at the distal esophagus, side-port in the region of mid esophagus. Right IJ central venous catheter tip over the SVC. Possible small left effusion. Atelectasis at the left base. IMPRESSION: 1. Right IJ central venous catheter tip over the SVC. No pneumothorax. 2. Esophageal tube tip at the distal esophagus, side-port in the mid esophagus. Recommend further advancement for more optimal positioning These results will be called to the ordering clinician or representative by the Radiologist Assistant, and communication documented in the PACS or [employer]. Electronically Signed   By: Donavan Foil M.D.   On: 07-21-2022 23:14   DG Chest Port 1 View  Result Date: Jul 21, 2022 CLINICAL DATA:  Intubated EXAM: PORTABLE CHEST 1 VIEW COMPARISON:  None Available. FINDINGS: Endotracheal tube tip is about 3.9 cm superior to the carina. Subsegmental atelectasis left base. No consolidation, pleural effusion or pneumothorax. Normal cardiac size IMPRESSION: Endotracheal tube tip about 3.9 cm superior to carina. Subsegmental atelectasis left base. Electronically Signed   By: Donavan Foil M.D.   On: 2022/07/21 22:15   PERIPHERAL VASCULAR CATHETERIZATION  Result Date: 07/21/2022 See surgical note for result.  CT Renal Stone Study  Result Date: Jul 21, 2022 CLINICAL DATA:  Left flank pain starting on Sunday EXAM: CT ABDOMEN AND PELVIS WITHOUT CONTRAST TECHNIQUE: Multidetector CT imaging of the abdomen and pelvis was performed following the standard protocol without IV contrast. RADIATION DOSE REDUCTION: This exam was performed according to the departmental dose-optimization program which includes automated exposure control, adjustment  of the mA and/or kV according to patient size and/or use of iterative reconstruction technique. COMPARISON:  Abdominal radiograph of 01/06/2009 and  report from CT abdomen from 02/26/2000 FINDINGS: Lower chest: Left anterior descending and right coronary artery atherosclerotic calcification. Descending thoracic aortic atherosclerosis. Small left pleural effusion. Small type 1 hiatal hernia. Emphysema noted at the lung bases. Mild scarring or atelectasis in both lower lobes. Hepatobiliary: Unremarkable Pancreas: Unremarkable Spleen: Unremarkable Adrenals/Urinary Tract: Both adrenal glands appear normal. There is some hematoma in the left perirenal space due to the aortic findings, see below. There is also moderate left hydronephrosis related to a 1.9 cm left ureteropelvic junction stone. Mild urinary bladder wall thickening is probably from nondistention, less likely from cystitis. Two additional left renal calculi are present but are nonobstructive, the larger measuring 0.5 cm in the lower pole. There is 0.4 cm right kidney lower pole nonobstructive renal calculus. Stomach/Bowel: The transverse duodenum is displaced in indistinct due to the large ruptured infrarenal abdominal aortic aneurysm. Anastomotic staple line in the distal sigmoid colon. Vascular/Lymphatic: Ruptured infrarenal abdominal aortic aneurysm. Complex appearance with some concentric partial ring calcifications; the outer margin of the aneurysm measures about 10.8 by 10.3 cm in diameter, but parts of this likely represent thrombus of different ages. There is retroperitoneal stranding in hematoma as well as adjacent hematoma tracking in the left retroperitoneum anterior to the left iloipsoas and towards the left groin. I estimate the retroperitoneal hemorrhage at about 380 cc with acute hemorrhagic components. There is a small amount of periaortic hematoma as well. The aneurysm starts just below the level of the renal arteries and extends down to the bifurcation. There is a small amount of right retroperitoneal stranding/hematoma as well as a trace amount of presacral edema. Reproductive:  Unremarkable Other: There is some mildly complex perihepatic ascites, potentially a small hemoperitoneum. Musculoskeletal: Mild bridging spurring of the sacroiliac joints. Moderate degenerative arthropathy of both hips. Small umbilical hernia contains adipose tissue. IMPRESSION: 1. Ruptured infrarenal abdominal aortic aneurysm, with 380 cc of blood products in the left retroperitoneum tracking primarily down along the iliopsoas towards the groin. There is a small amount of periaortic hematoma as well. Although there are complex elements inside the aneurysm, the outer wall of the aneurysm measures about 10.8 by 10.3 cm. The aneurysm starts just below the renal arteries and extends to the bifurcation. Emergent vascular surgical consultation recommended. 2. Small amount of complex perihepatic ascites, possibly hemoperitoneum. 3. Moderate left hydronephrosis related to a 1.9 cm left ureteropelvic junction stone. 4. Bilateral nonobstructive nephrolithiasis. 5. Small left pleural effusion. 6. Small type 1 hiatal hernia. 7. Coronary atherosclerosis. 8. Emphysema. 9. Moderate degenerative arthropathy of both hips. 10. Small umbilical hernia contains adipose tissue. Aortic Atherosclerosis (ICD10-I70.0) and Emphysema (ICD10-J43.9). Critical Value/emergent results were called by telephone at the time of interpretation on 06/23/2022 at 6:40 pm to provider [facility] , who verbally acknowledged these results. Electronically Signed   By: Van Clines M.D.   On: 07/12/2022 18:54    Microbiology Recent Results (from the past 240 hour(s))  MRSA Next Gen by PCR, Nasal     Status: None   Collection Time: 07/02/2022  9:41 PM  Result Value Ref Range Status   MRSA by PCR Next Gen NOT DETECTED NOT DETECTED Final    Comment: (NOTE) The GeneXpert MRSA Assay (FDA approved for NASAL specimens only), is one component of a comprehensive MRSA colonization surveillance program. It is not intended to diagnose MRSA infection nor to  guide or monitor treatment for MRSA infections. Test performance is not FDA approved in patients less than 54 years old. Performed at [facility], [address] [place], [place] [address]   SARS Coronavirus 2 by RT PCR (hospital order, performed in [facility] hospital lab) *cepheid single result test* Anterior Nasal Swab     Status: None   Collection Time: 07/19/2022 10:06 PM   Specimen: Anterior Nasal Swab  Result Value Ref Range Status   SARS Coronavirus 2 by RT PCR NEGATIVE NEGATIVE Final    Comment: (NOTE) SARS-CoV-2 target nucleic acids are NOT DETECTED.  The SARS-CoV-2 RNA is generally detectable in upper and lower respiratory specimens during the acute phase of infection. The lowest concentration of SARS-CoV-2 viral copies this assay can detect is 250 copies / mL. A negative result does not preclude SARS-CoV-2 infection and should not be used as the sole basis for treatment or other patient management decisions.  A negative result may occur with improper specimen collection / handling, submission of specimen other than nasopharyngeal swab, presence of viral mutation(s) within the areas targeted by this assay, and inadequate number of viral copies (<250 copies / mL). A negative result must be combined with clinical observations, patient history, and epidemiological information.  Fact Sheet for Patients:   https://www.patel.info/  Fact Sheet for Healthcare Providers: https://hall.com/  This test is not yet approved or  cleared by the [place] FDA and has been authorized for detection and/or diagnosis of SARS-CoV-2 by FDA under an Emergency Use Authorization (EUA).  This EUA will remain in effect (meaning this test can be used) for the duration of the COVID-19 declaration under Section 564(b)(1) of the Act, 21 U.S.C. section 360bbb-3(b)(1), unless the authorization is terminated or revoked sooner.  Performed at  [facility], [address] [place], [place] [address]     Lab Basic Metabolic Panel: Recent Labs  Lab 07/13/2022 1803 06/25/2022 2155 06/22/2022 2354  NA 133* 135 134*  K 4.8 4.9 5.6*  CL 100 112* 110  CO2 20* 18* 17*  GLUCOSE 168* 187* 342*  BUN 54* 48* 47*  CREATININE 3.51* 3.10* 3.16*  CALCIUM 8.6* 6.1* 5.8*  MG  --  2.0 2.0  PHOS  --  7.1* 8.6*   Liver Function Tests: Recent Labs  Lab 06/21/2022 2155 06/21/2022 2354  AST 23 23  ALT 13 11  ALKPHOS 45 32*  BILITOT 1.1 1.0  PROT 3.4* <3.0*  ALBUMIN 1.7* <1.5*   No results for input(s): "LIPASE", "AMYLASE" in the last 168 hours. No results for input(s): "AMMONIA" in the last 168 hours. CBC: Recent Labs  Lab 06/22/2022 1803 07/10/2022 2155 06/20/2022 2354  WBC 12.3* 8.0 4.9  NEUTROABS  --  6.5 4.1  HGB 8.6* 8.0* 5.3*  HCT 24.5* 23.8* 16.6*  MCV 125.6* 97.9 100.6*  PLT 159 72* 49*   Cardiac Enzymes: No results for input(s): "CKTOTAL", "CKMB", "CKMBINDEX", "TROPONINI" in the last 168 hours. Sepsis Labs: Recent Labs  Lab 07/12/2022 1803 07/16/2022 2155 07/18/2022 2354 07-20-2022 0051  WBC 12.3* 8.0 4.9  --   LATICACIDVEN  --   --  7.7* >9.0*    Procedures/Operations  Ruptured Juxtarenal AAA repair  Mechanical Intubation  Right Internal Jugular Central Line Placement  Donell Beers, [place] Pager [phone] (please enter 7 digits) PCCM Consult Pager [phone] (please enter 7 digits)

## 2022-07-20 DEATH — deceased

## 2022-10-10 ENCOUNTER — Ambulatory Visit: Payer: Medicare Other | Admitting: Internal Medicine

## 2022-10-10 ENCOUNTER — Other Ambulatory Visit: Payer: Medicare Other
